# Patient Record
Sex: Female | Born: 1960 | Race: Black or African American | Hispanic: No | Marital: Single | Smoking: Current some day smoker
Health system: Southern US, Community
[De-identification: ages and names within clinical notes are randomized; demographics above are authoritative.]

## PROBLEM LIST (undated history)

## (undated) HISTORY — PX: TUBAL LIGATION: SHX77

---

## 1998-03-04 ENCOUNTER — Other Ambulatory Visit: Admission: RE | Admit: 1998-03-04 | Discharge: 1998-03-04 | Payer: Self-pay | Admitting: Obstetrics

## 2001-09-26 ENCOUNTER — Emergency Department (HOSPITAL_COMMUNITY): Admission: EM | Admit: 2001-09-26 | Discharge: 2001-09-26 | Payer: Self-pay | Admitting: *Deleted

## 2003-07-30 ENCOUNTER — Emergency Department (HOSPITAL_COMMUNITY): Admission: EM | Admit: 2003-07-30 | Discharge: 2003-07-30 | Payer: Self-pay

## 2004-11-17 ENCOUNTER — Emergency Department (HOSPITAL_COMMUNITY): Admission: EM | Admit: 2004-11-17 | Discharge: 2004-11-17 | Payer: Self-pay | Admitting: Emergency Medicine

## 2004-11-19 ENCOUNTER — Emergency Department (HOSPITAL_COMMUNITY): Admission: EM | Admit: 2004-11-19 | Discharge: 2004-11-19 | Payer: Self-pay | Admitting: Emergency Medicine

## 2004-11-26 ENCOUNTER — Encounter: Admission: RE | Admit: 2004-11-26 | Discharge: 2004-11-26 | Payer: Self-pay | Admitting: Family Medicine

## 2004-11-26 ENCOUNTER — Encounter: Admission: RE | Admit: 2004-11-26 | Discharge: 2004-12-18 | Payer: Self-pay | Admitting: Family Medicine

## 2005-01-20 ENCOUNTER — Emergency Department (HOSPITAL_COMMUNITY): Admission: EM | Admit: 2005-01-20 | Discharge: 2005-01-20 | Payer: Self-pay | Admitting: Emergency Medicine

## 2006-06-16 ENCOUNTER — Emergency Department (HOSPITAL_COMMUNITY): Admission: EM | Admit: 2006-06-16 | Discharge: 2006-06-16 | Payer: Self-pay | Admitting: Emergency Medicine

## 2007-10-16 ENCOUNTER — Emergency Department (HOSPITAL_COMMUNITY): Admission: EM | Admit: 2007-10-16 | Discharge: 2007-10-16 | Payer: Self-pay | Admitting: Emergency Medicine

## 2008-04-22 ENCOUNTER — Emergency Department (HOSPITAL_COMMUNITY): Admission: EM | Admit: 2008-04-22 | Discharge: 2008-04-22 | Payer: Self-pay | Admitting: Family Medicine

## 2008-08-04 ENCOUNTER — Emergency Department (HOSPITAL_COMMUNITY): Admission: EM | Admit: 2008-08-04 | Discharge: 2008-08-04 | Payer: Self-pay | Admitting: Emergency Medicine

## 2009-08-11 ENCOUNTER — Emergency Department (HOSPITAL_COMMUNITY): Admission: EM | Admit: 2009-08-11 | Discharge: 2009-08-11 | Payer: Self-pay | Admitting: Emergency Medicine

## 2009-12-04 ENCOUNTER — Emergency Department (HOSPITAL_COMMUNITY): Admission: EM | Admit: 2009-12-04 | Discharge: 2009-12-04 | Payer: Self-pay | Admitting: Emergency Medicine

## 2010-01-10 ENCOUNTER — Emergency Department (HOSPITAL_COMMUNITY): Admission: EM | Admit: 2010-01-10 | Discharge: 2010-01-10 | Payer: Self-pay | Admitting: Emergency Medicine

## 2010-02-28 ENCOUNTER — Emergency Department (HOSPITAL_COMMUNITY): Admission: EM | Admit: 2010-02-28 | Discharge: 2010-02-28 | Payer: Self-pay | Admitting: Emergency Medicine

## 2010-08-27 ENCOUNTER — Emergency Department (HOSPITAL_COMMUNITY)
Admission: EM | Admit: 2010-08-27 | Discharge: 2010-08-27 | Payer: Self-pay | Source: Home / Self Care | Admitting: Emergency Medicine

## 2010-11-18 LAB — URINE MICROSCOPIC-ADD ON

## 2010-11-18 LAB — COMPREHENSIVE METABOLIC PANEL
Albumin: 3.7 g/dL (ref 3.5–5.2)
Alkaline Phosphatase: 88 U/L (ref 39–117)
Calcium: 9 mg/dL (ref 8.4–10.5)
Chloride: 105 mEq/L (ref 96–112)
Creatinine, Ser: 0.66 mg/dL (ref 0.4–1.2)
GFR calc Af Amer: >60 mL/min (ref >60)
GFR calc non Af Amer: >60 mL/min (ref >60)
Glucose, Bld: 93 mg/dL (ref 70–99)
Sodium: 138 mEq/L (ref 135–145)
Total Bilirubin: 0.4 mg/dL (ref 0.3–1.2)
Total Protein: 6.9 g/dL (ref 6.0–8.3)

## 2010-11-18 LAB — URINALYSIS, ROUTINE W REFLEX MICROSCOPIC
Bilirubin Urine: NEGATIVE
Glucose, UA: NEGATIVE mg/dL
Nitrite: NEGATIVE
Specific Gravity, Urine: 1.028 (ref 1.005–1.030)
Urobilinogen, UA: 0.2 mg/dL (ref 0.0–1.0)

## 2010-11-18 LAB — CBC
HCT: 39.1 % (ref 36.0–46.0)
MCV: 83.5 fL (ref 78.0–100.0)
RBC: 4.68 MIL/uL (ref 3.87–5.11)
WBC: 7 10*3/uL (ref 4.0–10.5)

## 2010-11-18 LAB — DIFFERENTIAL
Basophils Absolute: 0 10*3/uL (ref 0.0–0.1)
Basophils Relative: 0 % (ref 0–1)
Eosinophils Absolute: 0.1 10*3/uL (ref 0.0–0.7)
Eosinophils Relative: 1 % (ref 0–5)
Lymphocytes Relative: 20 % (ref 12–46)

## 2010-11-18 LAB — LIPASE, BLOOD: Lipase: 23 U/L (ref 11–59)

## 2010-11-18 LAB — PREGNANCY, URINE: Preg Test, Ur: NEGATIVE

## 2011-05-17 ENCOUNTER — Emergency Department (HOSPITAL_COMMUNITY)
Admission: EM | Admit: 2011-05-17 | Discharge: 2011-05-17 | Disposition: A | Discharge status: Home / Self Care | Payer: Self-pay | Attending: Emergency Medicine | Admitting: Emergency Medicine

## 2011-05-17 DIAGNOSIS — R110 Nausea: Secondary | ICD-10-CM | POA: Insufficient documentation

## 2011-05-17 DIAGNOSIS — F17200 Nicotine dependence, unspecified, uncomplicated: Secondary | ICD-10-CM | POA: Insufficient documentation

## 2011-05-17 DIAGNOSIS — R109 Unspecified abdominal pain: Secondary | ICD-10-CM | POA: Insufficient documentation

## 2011-05-17 DIAGNOSIS — R12 Heartburn: Secondary | ICD-10-CM | POA: Insufficient documentation

## 2011-05-17 LAB — URINE MICROSCOPIC-ADD ON

## 2011-05-17 LAB — URINALYSIS, ROUTINE W REFLEX MICROSCOPIC
Ketones, ur: NEGATIVE mg/dL
Leukocytes, UA: NEGATIVE
Protein, ur: NEGATIVE mg/dL

## 2011-05-17 LAB — PREGNANCY, URINE: Preg Test, Ur: NEGATIVE

## 2011-05-21 LAB — COMPREHENSIVE METABOLIC PANEL WITH GFR
ALT: 15
AST: 16
CO2: 23
GFR calc Af Amer: >60
GFR calc non Af Amer: >60
Potassium: 4
Sodium: 140

## 2011-05-21 LAB — CBC
HCT: 39.7
Hemoglobin: 13.4
MCHC: 33.7
MCV: 83.6
Platelets: 247
RBC: 4.75
RDW: 17.5 — ABNORMAL HIGH
WBC: 10.1

## 2011-05-21 LAB — URINE MICROSCOPIC-ADD ON

## 2011-05-21 LAB — DIFFERENTIAL
Basophils Absolute: 0.1
Basophils Relative: 1
Eosinophils Absolute: 0.1
Eosinophils Relative: 1
Lymphocytes Relative: 10 — ABNORMAL LOW
Lymphs Abs: 1
Monocytes Absolute: 0.3
Monocytes Relative: 3
Neutro Abs: 8.6 — ABNORMAL HIGH
Neutrophils Relative %: 85 — ABNORMAL HIGH

## 2011-05-21 LAB — URINALYSIS, ROUTINE W REFLEX MICROSCOPIC
Bilirubin Urine: NEGATIVE
Nitrite: NEGATIVE
Urobilinogen, UA: 0.2

## 2011-05-21 LAB — COMPREHENSIVE METABOLIC PANEL
Albumin: 3.6
Alkaline Phosphatase: 62
BUN: 11
Calcium: 9.1
Chloride: 109
Creatinine, Ser: 0.61
Glucose, Bld: 134 — ABNORMAL HIGH
Total Bilirubin: 0.7
Total Protein: 6.8

## 2011-05-21 LAB — LIPASE, BLOOD: Lipase: 18

## 2011-06-04 LAB — RAPID STREP SCREEN (MED CTR MEBANE ONLY)

## 2011-09-10 ENCOUNTER — Emergency Department (HOSPITAL_COMMUNITY)
Admission: EM | Admit: 2011-09-10 | Discharge: 2011-09-10 | Disposition: A | Discharge status: Home / Self Care | Payer: Self-pay | Attending: Emergency Medicine | Admitting: Emergency Medicine

## 2011-09-10 ENCOUNTER — Encounter (HOSPITAL_COMMUNITY): Payer: Self-pay | Admitting: Emergency Medicine

## 2011-09-10 DIAGNOSIS — E669 Obesity, unspecified: Secondary | ICD-10-CM | POA: Insufficient documentation

## 2011-09-10 DIAGNOSIS — R059 Cough, unspecified: Secondary | ICD-10-CM | POA: Insufficient documentation

## 2011-09-10 DIAGNOSIS — R509 Fever, unspecified: Secondary | ICD-10-CM | POA: Insufficient documentation

## 2011-09-10 DIAGNOSIS — R05 Cough: Secondary | ICD-10-CM | POA: Insufficient documentation

## 2011-09-10 MED ORDER — HYDROCOD POLST-CHLORPHEN POLST 10-8 MG/5ML PO LQCR: 5.0000 mL | Freq: Two times a day (BID) | ORAL | Status: DC | PRN

## 2011-09-10 NOTE — ED Provider Notes (Signed)
History     CSN: [id number]  Arrival date & time 09/10/11  1513   First MD Initiated Contact with Patient 09/10/11 1615      Chief Complaint  Patient presents with  . Cough    C/O cough, nasal congestion and "itchy" throat since Wednesday    (Consider location/radiation/quality/duration/timing/severity/associated sxs/prior treatment) HPI Complains of cough itchy throat, nasal congestion onset 2 days ago. Treated with Mucinex and Tylenol cold medicine without adequate relief. Is worse at night not made better or worse by anything. Maximum temperature 101 two days ago no fever since. No other complaint no other associated symptoms No past medical history on file.  No past surgical history on file.  No family history on file.  History  Substance Use Topics  . Smoking status: Not on file  . Smokeless tobacco: Not on file  . Alcohol Use: Not on file   former smoker, quit 12/12, occasional alcohol no illicit drug use OB History    No data available      Review of Systems  Constitutional: Positive for fever.  HENT: Positive for congestion.   Respiratory: Positive for cough.   Cardiovascular: Negative.   Gastrointestinal: Negative.   Musculoskeletal: Negative.   Skin: Negative.   Neurological: Negative.   Hematological: Negative.   Psychiatric/Behavioral: Negative.     Allergies  Review of patient's allergies indicates no known allergies.  Home Medications   Current Outpatient Rx  Name Route Sig Dispense Refill  . NAPROXEN SODIUM 220 MG PO TABS Oral Take 220 mg by mouth 2 (two) times daily as needed. pain      LMP 09/08/2011  Physical Exam  Constitutional: She appears well-developed and well-nourished.  HENT:  Head: Normocephalic and atraumatic.  Mouth/Throat: No oropharyngeal exudate.       Bilateral tympanic membranes normal oral pharynx reddened no exudate  Eyes: Conjunctivae are normal. Pupils are equal, round, and reactive to light.  Neck: Neck supple.  No tracheal deviation present. No thyromegaly present.  Cardiovascular: Normal rate and regular rhythm.   No murmur heard. Pulmonary/Chest: Effort normal and breath sounds normal.  Abdominal: Soft. Bowel sounds are normal. She exhibits no distension. There is no tenderness.       obese  Musculoskeletal: Normal range of motion. She exhibits no edema and no tenderness.  Neurological: She is alert. Coordination normal.  Skin: Skin is warm and dry. No rash noted.  Psychiatric: She has a normal mood and affect.    ED Course  Procedures (including critical care time)  Labs Reviewed - No data to display No results found.   No diagnosis found.    MDM  Suspect viral illness. Plan Rx Tussionex tussionex bp recheck      Diagnosis cough        Doug Sou, MD 09/10/11 1637

## 2012-01-02 ENCOUNTER — Encounter (HOSPITAL_COMMUNITY): Payer: Self-pay | Admitting: Emergency Medicine

## 2012-01-02 ENCOUNTER — Emergency Department (HOSPITAL_COMMUNITY)
Admission: EM | Admit: 2012-01-02 | Discharge: 2012-01-02 | Disposition: A | Discharge status: Home / Self Care | Payer: Self-pay | Attending: Emergency Medicine | Admitting: Emergency Medicine

## 2012-01-02 DIAGNOSIS — N390 Urinary tract infection, site not specified: Secondary | ICD-10-CM | POA: Insufficient documentation

## 2012-01-02 DIAGNOSIS — K529 Noninfective gastroenteritis and colitis, unspecified: Secondary | ICD-10-CM

## 2012-01-02 DIAGNOSIS — K5289 Other specified noninfective gastroenteritis and colitis: Secondary | ICD-10-CM | POA: Insufficient documentation

## 2012-01-02 DIAGNOSIS — R1110 Vomiting, unspecified: Secondary | ICD-10-CM | POA: Insufficient documentation

## 2012-01-02 DIAGNOSIS — R197 Diarrhea, unspecified: Secondary | ICD-10-CM | POA: Insufficient documentation

## 2012-01-02 DIAGNOSIS — F17200 Nicotine dependence, unspecified, uncomplicated: Secondary | ICD-10-CM | POA: Insufficient documentation

## 2012-01-02 LAB — URINALYSIS, ROUTINE W REFLEX MICROSCOPIC
Glucose, UA: NEGATIVE mg/dL
Ketones, ur: 15 mg/dL — AB
Protein, ur: 100 mg/dL — AB
pH: 5.5 (ref 5.0–8.0)

## 2012-01-02 LAB — URINE MICROSCOPIC-ADD ON

## 2012-01-02 LAB — POCT I-STAT, CHEM 8
BUN: 12 mg/dL (ref 6–23)
Chloride: 105 mEq/L (ref 96–112)
Creatinine, Ser: 0.6 mg/dL (ref 0.50–1.10)
Sodium: 138 mEq/L (ref 135–145)
TCO2: 24 mmol/L (ref 0–100)

## 2012-01-02 LAB — POCT PREGNANCY, URINE: Preg Test, Ur: NEGATIVE

## 2012-01-02 MED ORDER — ONDANSETRON HCL 4 MG/2ML IJ SOLN
4.0000 mg | Freq: Once | INTRAMUSCULAR | Status: AC
  Administered 2012-01-02: 4 mg via INTRAVENOUS
  Filled 2012-01-02: qty 2

## 2012-01-02 MED ORDER — SODIUM CHLORIDE 0.9 % IV BOLUS (SEPSIS)
1000.0000 mL | Freq: Once | INTRAVENOUS | Status: AC
  Administered 2012-01-02: 1000 mL via INTRAVENOUS

## 2012-01-02 MED ORDER — SULFAMETHOXAZOLE-TRIMETHOPRIM 800-160 MG PO TABS: 1.0000 | ORAL_TABLET | Freq: Two times a day (BID) | ORAL | Status: AC

## 2012-01-02 MED ORDER — ACETAMINOPHEN 325 MG PO TABS
650.0000 mg | ORAL_TABLET | Freq: Once | ORAL | Status: AC
  Administered 2012-01-02: 650 mg via ORAL

## 2012-01-02 MED ORDER — ONDANSETRON HCL 4 MG PO TABS: 4.0000 mg | ORAL_TABLET | Freq: Four times a day (QID) | ORAL | Status: AC

## 2012-01-02 MED ORDER — ACETAMINOPHEN 325 MG PO TABS
ORAL_TABLET | ORAL | Status: AC
  Administered 2012-01-02: 650 mg via ORAL
  Filled 2012-01-02: qty 2

## 2012-01-02 NOTE — ED Notes (Signed)
Pt c/o N/V/D starting this am while on way to work; pt sts may have been around someone that was sick

## 2012-01-02 NOTE — ED Provider Notes (Signed)
History     CSN: [id number]  Arrival date & time 01/02/12  1311   First MD Initiated Contact with Patient 01/02/12 1334      Chief Complaint  Patient presents with  . Emesis  . 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 female presenting with vomiting and diarrhea. The history is provided by the patient. No language interpreter was used.  Emesis  This is a new problem. The current episode started 1 to 2 hours ago. The problem occurs 2 to 4 times per day. The problem has not changed since onset.The emesis has an appearance of stomach contents. There has been no fever. Associated symptoms include abdominal pain and diarrhea. Pertinent negatives include no fever.  Diarrhea The primary symptoms include abdominal pain, vomiting and diarrhea. Primary symptoms do not include fever. The illness began today. The problem has not changed since onset. The illness does not include back pain.    History reviewed. No pertinent past medical history.  History reviewed. No pertinent past surgical history.  History reviewed. No pertinent family history.  History  Substance Use Topics  . Smoking status: Current Some Day Smoker    Last Attempt to Quit: 08/27/2011  . Smokeless tobacco: Not on file  . Alcohol Use: No     occcasional    OB History    Grav Para Term Preterm Abortions TAB SAB Ect Mult Living                  Review of Systems  Constitutional: Negative for fever.  Respiratory: Negative.   Cardiovascular: Negative.   Gastrointestinal: Positive for vomiting, abdominal pain and diarrhea.  Musculoskeletal: Negative for back pain.  Neurological: Negative.     Allergies  Review of patient's allergies indicates no known allergies.  Home Medications   Current Outpatient Rx  Name Route Sig Dispense Refill  . ADULT MULTIVITAMIN  W/MINERALS CH Oral Take 1 tablet by mouth daily.      BP 126/64  Pulse 98  Temp(Src) 98.1 F (36.7 C) (Oral)  Resp 16  SpO2 99%  Physical Exam  Nursing note and vitals reviewed. Constitutional: She is oriented to person, place, and time. She appears well-developed and well-nourished.  HENT:  Head: Normocephalic and atraumatic.  Neck: Neck supple.  Cardiovascular: Normal rate and regular rhythm.   Pulmonary/Chest: Effort normal and breath sounds normal.  Abdominal: Soft. Bowel sounds are normal. There is no tenderness.  Musculoskeletal: Normal range of motion.  Neurological: She is alert and oriented to person, place, and time.  Skin: Skin is warm and dry.    ED Course  Procedures (including critical care time)  Labs Reviewed - No data to display No results found.   1. UTI (lower urinary tract infection)   2. Gastroenteritis       MDM  Pt is tolerating po and is feeling better at this time        Teressa Lower, NP 01/02/12 1642  Teressa Lower, NP 01/02/12 1642

## 2012-01-02 NOTE — ED Provider Notes (Signed)
Medical screening examination/treatment/procedure(s) were performed by non-physician practitioner and as supervising physician I was immediately available for consultation/collaboration.   Kristy Schomburg A Darenda Fike, MD 01/02/12 1952 

## 2012-01-02 NOTE — Discharge Instructions (Signed)
B.R.A.T. Diet Your doctor has recommended the B.R.A.T. diet for you or your child until the condition improves. This is often used to help control diarrhea and vomiting symptoms. If you or your child can tolerate clear liquids, you may have:  Bananas.   Rice.   Applesauce.   Toast (and other simple starches such as crackers, potatoes, noodles).  Be sure to avoid dairy products, meats, and fatty foods until symptoms are better. Fruit juices such as apple, grape, and prune juice can make diarrhea worse. Avoid these. Continue this diet for 2 days or as instructed by your caregiver. Document Released: 08/16/2005 Document Revised: 08/05/2011 Document Reviewed: 02/02/2007 Exit[employer] Patient Information 2012 [employer]. 

## 2013-05-24 ENCOUNTER — Emergency Department (HOSPITAL_COMMUNITY)
Admission: EM | Admit: 2013-05-24 | Discharge: 2013-05-24 | Disposition: A | Discharge status: Home / Self Care | Payer: Self-pay | Attending: Emergency Medicine | Admitting: Emergency Medicine

## 2013-05-24 ENCOUNTER — Encounter (HOSPITAL_COMMUNITY): Payer: Self-pay | Admitting: Family Medicine

## 2013-05-24 DIAGNOSIS — J3489 Other specified disorders of nose and nasal sinuses: Secondary | ICD-10-CM | POA: Insufficient documentation

## 2013-05-24 DIAGNOSIS — S0501XA Injury of conjunctiva and corneal abrasion without foreign body, right eye, initial encounter: Secondary | ICD-10-CM

## 2013-05-24 DIAGNOSIS — Y929 Unspecified place or not applicable: Secondary | ICD-10-CM | POA: Insufficient documentation

## 2013-05-24 DIAGNOSIS — F17200 Nicotine dependence, unspecified, uncomplicated: Secondary | ICD-10-CM | POA: Insufficient documentation

## 2013-05-24 DIAGNOSIS — X58XXXA Exposure to other specified factors, initial encounter: Secondary | ICD-10-CM | POA: Insufficient documentation

## 2013-05-24 DIAGNOSIS — Z79899 Other long term (current) drug therapy: Secondary | ICD-10-CM | POA: Insufficient documentation

## 2013-05-24 DIAGNOSIS — S058X9A Other injuries of unspecified eye and orbit, initial encounter: Secondary | ICD-10-CM | POA: Insufficient documentation

## 2013-05-24 DIAGNOSIS — Y939 Activity, unspecified: Secondary | ICD-10-CM | POA: Insufficient documentation

## 2013-05-24 MED ORDER — ERYTHROMYCIN 5 MG/GM OP OINT: TOPICAL_OINTMENT | OPHTHALMIC | Status: DC

## 2013-05-24 MED ORDER — HYDROCODONE-ACETAMINOPHEN 5-325 MG PO TABS: 1.0000 | ORAL_TABLET | Freq: Four times a day (QID) | ORAL | Status: DC | PRN

## 2013-05-24 MED ORDER — TETRACAINE HCL 0.5 % OP SOLN
2.0000 [drp] | Freq: Once | OPHTHALMIC | Status: DC
  Filled 2013-05-24: qty 2

## 2013-05-24 NOTE — ED Notes (Signed)
Patient states that her eyes have been itching, watering and burning since yesterday.

## 2013-05-24 NOTE — ED Provider Notes (Signed)
Medical screening examination/treatment/procedure(s) were conducted as a shared visit with non-physician practitioner(s) and myself.  I personally evaluated the patient during the encounter  Patient with eye irritation. Corneal abrasion at 6 o'clock with vague borders. No dendritic branching, no concerns for ulcer. Given erythro ointiment.   Dagmar Hait, MD 05/24/13 [phone]

## 2013-05-24 NOTE — ED Provider Notes (Signed)
CSN: [id number]     Arrival date & time 05/24/13  7829 History   First MD Initiated Contact with Patient 05/24/13 [phone]     Chief Complaint  Patient presents with  . Eye Drainage   (Consider location/radiation/quality/duration/timing/severity/associated sxs/prior Treatment) HPI Comments: Patient presents today with itchy,watery eyes.  She reports that her symptoms have been present since yesterday.  Symptoms worse in the right eye.  She does have some eye pain of the right eye.  She does have a history of seasonal allergies and has been taking Zyrtec, but does not feel that it is helping.  She also reports that her right eye has feel irritated and painful.  She feels a foreign body sensation in her right eye.  She denies any trauma to the eye.  She denies any thick discharge from her eyes.  Denies any swelling.  She does report some photophobia of the right eye.    She reports that her vision appears to be blurred particularly in the right eye.    She does not wear corrective lenses.  She has had some associated rhinorrhea.    The history is provided by the patient.    History reviewed. No pertinent past medical history. Past Surgical History  Procedure Laterality Date  . Tubal ligation     No family history on file. History  Substance Use Topics  . Smoking status: Current Some Day Smoker -- 0.25 packs/day    Types: Cigarettes  . Smokeless tobacco: Not on file  . Alcohol Use: No     Comment: occcasional   OB History   Grav Para Term Preterm Abortions TAB SAB Ect Mult Living                 Review of Systems  HENT: Positive for rhinorrhea.   Eyes: Positive for pain, discharge (watery discharge), redness and itching.  All other systems reviewed and are negative.    Allergies  Review of patient's allergies indicates no known allergies.  Home Medications   Current Outpatient Rx  Name  Route  Sig  Dispense  Refill  . Multiple Vitamin (MULITIVITAMIN WITH MINERALS) TABS    Oral   Take 1 tablet by mouth daily.          BP 159/71  Pulse 79  Temp(Src) 97.8 F (36.6 C) (Oral)  Resp 18  Ht 5\' 7"  (1.702 m)  Wt 264 lb (119.75 kg)  BMI 41.34 kg/m2  SpO2 99%  LMP 05/22/2013 Physical Exam  Nursing note and vitals reviewed. Constitutional: She appears well-developed and well-nourished.  HENT:  Head: Normocephalic and atraumatic.  Mouth/Throat: Oropharynx is clear and moist.  Eyes: EOM and lids are normal. Pupils are equal, round, and reactive to light. Lids are everted and swept, no foreign bodies found. Right eye exhibits no exudate. No foreign body present in the right eye.  Slit lamp exam:      The right eye shows corneal abrasion and fluorescein uptake.  Mild injection of the right eye.  Watery discharge of the right eye.  IOP right eye 17.  Neck: Normal range of motion. Neck supple.  Cardiovascular: Normal rate, regular rhythm and normal heart sounds.   Pulmonary/Chest: Effort normal and breath sounds normal.  Neurological: She is alert.  Skin: Skin is warm and dry. No rash noted.  No facial rash visualized  Psychiatric: She has a normal mood and affect.    ED Course  Procedures (including critical care time) Labs Review Labs Reviewed -  No data to display Imaging Review No results found.  Patient discussed with Dr. Gwendolyn Grant who also evaluated the patient.  MDM  No diagnosis found. Patient presents with itching and irritation of her right eye.  She has also had some watery discharge.  No trauma to the eye.  IOP of the right eye is 17.  On exam patient found to have a corneal abrasion.  Patient started on Erythromycin ointment and given pain medication.  Patient instructed to follow up with Opthalmology.  Return precautions discussed.    Pascal Lux [place], PA-C 05/24/13 0825  Magnus Sinning, PA-C 05/24/13 [phone]

## 2013-09-25 ENCOUNTER — Ambulatory Visit (INDEPENDENT_AMBULATORY_CARE_PROVIDER_SITE_OTHER): Payer: PRIVATE HEALTH INSURANCE | Admitting: Physician Assistant

## 2013-09-25 VITALS — BP 118/72 | HR 99 | Temp 99.0°F | Resp 18 | Ht 65.5 in | Wt 274.0 lb

## 2013-09-25 DIAGNOSIS — R509 Fever, unspecified: Secondary | ICD-10-CM

## 2013-09-25 DIAGNOSIS — R05 Cough: Secondary | ICD-10-CM

## 2013-09-25 DIAGNOSIS — J020 Streptococcal pharyngitis: Secondary | ICD-10-CM

## 2013-09-25 DIAGNOSIS — R062 Wheezing: Secondary | ICD-10-CM

## 2013-09-25 DIAGNOSIS — R059 Cough, unspecified: Secondary | ICD-10-CM

## 2013-09-25 LAB — POCT CBC
Granulocyte percent: 60.3 %G (ref 37–80)
HCT, POC: 43.6 % (ref 37.7–47.9)
Hemoglobin: 13.2 g/dL (ref 12.2–16.2)
LYMPH, POC: 3.7 — AB (ref 0.6–3.4)
MCH, POC: 25.5 pg — AB (ref 27–31.2)
MCHC: 30.3 g/dL — AB (ref 31.8–35.4)
MCV: 84.4 fL (ref 80–97)
MID (cbc): 0.7 (ref 0–0.9)
MPV: 8.9 fL (ref 0–99.8)
POC GRANULOCYTE: 6.6 (ref 2–6.9)
POC LYMPH %: 33.5 % (ref 10–50)
POC MID %: 6.2 % (ref 0–12)
Platelet Count, POC: 330 10*3/uL (ref 142–424)
RBC: 5.17 M/uL (ref 4.04–5.48)
RDW, POC: 17.4 %
WBC: 11 10*3/uL — AB (ref 4.6–10.2)

## 2013-09-25 LAB — POCT INFLUENZA A/B
INFLUENZA A, POC: NEGATIVE
Influenza B, POC: NEGATIVE

## 2013-09-25 LAB — POCT RAPID STREP A (OFFICE): Rapid Strep A Screen: POSITIVE — AB

## 2013-09-25 MED ORDER — AMOXICILLIN 875 MG PO TABS: 875.0000 mg | ORAL_TABLET | Freq: Two times a day (BID) | ORAL | Status: DC

## 2013-09-25 MED ORDER — ALBUTEROL SULFATE (2.5 MG/3ML) 0.083% IN NEBU
2.5000 mg | INHALATION_SOLUTION | Freq: Once | RESPIRATORY_TRACT | Status: AC
  Administered 2013-09-25: 2.5 mg via RESPIRATORY_TRACT

## 2013-09-25 MED ORDER — HYDROCODONE-HOMATROPINE 5-1.5 MG/5ML PO SYRP: ORAL_SOLUTION | ORAL | Status: DC

## 2013-09-25 MED ORDER — E-Z SPACER DEVI: Status: DC

## 2013-09-25 MED ORDER — ALBUTEROL SULFATE HFA 108 (90 BASE) MCG/ACT IN AERS: 2.0000 | INHALATION_SPRAY | RESPIRATORY_TRACT | Status: DC | PRN

## 2013-09-25 NOTE — Progress Notes (Signed)
Subjective:    Patient ID: Haley Erickson, female    DOB: 14-Jun-1961, 53 y.o.   MRN: [id number]  HPI 53 year old female presents with a 4 day history of nasal congestion, post nasal drip, sore throat, and cough. Mild sinus pressure. Subjective fever and chills. Nasal congestion thick and green/yellow. Cough is productive of green sputum and worse as the day progresses and into nighttime. Some SOB and wheezing. Ears feel full, leading to sensation of muffled hearing. Symptoms began with sore throat. Has tried OTC cold preps without success. No GI complaints. Appetite normal. 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 Multiple people out sick with similar illness. She did get a flu shot this year. Some tobacco use.   No recent antibiotics or recent travels.   No leg trauma, sedentary periods, or h/o cancer.  She does have a history of asthma as a child, but states she "grew out of this."   PMH: History reviewed. No pertinent past medical history.   Home Meds: Prior to Admission medications   Not on File    Allergies: No Known Allergies  History   Social History  . Marital Status: Single    Spouse Name: N/A    Number of Children: N/A  . Years of Education: N/A   Occupational History  . Not on file.   Social History Main Topics  . Smoking status: Current Some Day Smoker -- 0.25 packs/day    Types: Cigarettes  . Smokeless tobacco: Not on file  . Alcohol Use: No     Comment: occcasional  . Drug Use: No  . Sexual Activity: Not on file   Other Topics Concern  . Not on file   Social History Narrative  . No narrative on file       Review of Systems  Constitutional: Positive for chills. Negative for fever and fatigue.  HENT: Positive for congestion, ear pain, hearing loss, postnasal drip, rhinorrhea, sinus pressure and sore throat.        Laryngitis.  Sinus pain along the maxillary sinus.   Respiratory: Positive for  cough, shortness of breath and wheezing.        Cough is productive of green sputum and worse later during the day/nighttime.   Gastrointestinal: Positive for vomiting. Negative for nausea and diarrhea.       Vomiting secondary to coughing episodes.   Musculoskeletal: Positive for myalgias.  Allergic/Immunologic: Positive for environmental allergies.  Neurological: Positive for headaches.       Sinus headaches.        Objective:   Physical Exam  Physical Exam: Blood pressure 118/72, pulse 99, temperature 99 F (37.2 C), temperature source Oral, resp. rate 18, height 5' 5.5" (1.664 m), weight 274 lb (124.286 kg), last menstrual period 05/22/2013, SpO2 99.00%., Body mass index is 44.89 kg/(m^2). General: Well developed, well nourished, in no acute distress. Head: Normocephalic, atraumatic, eyes without discharge, sclera non-icteric, nares are congested. Bilateral auditory canals clear, TM's are without perforation, pearly grey with reflective cone of light bilaterally. No sinus TTP. Oral cavity moist, dentition normal. Posterior pharynx with post nasal drip and mild erythema. No peritonsillar abscess or tonsillar exudate. Uvula midline.  Neck: Supple. No thyromegaly. Full ROM. Lymph nodes: less than 2 cm AC bilaterally. Lungs: Clear to auscultation bilaterally with mild bilateral wheezing. No rales or rhonchi. Breathing is unlabored. Status post albuterol neb: Heart: RRR with S1 S2. No murmurs, rubs, or gallops  appreciated. Msk:  Strength and tone normal for age. Extremities: No clubbing or cyanosis. No edema. Neuro: Alert and oriented X 3. Moves all extremities spontaneously. CNII-XII grossly in tact. Psych:  Responds to questions appropriately with a normal affect.   Labs: Results for orders placed in visit on 09/25/13  POCT CBC      Result Value Range   WBC 11.0 (*) 4.6 - 10.2 K/uL   Lymph, poc 3.7 (*) 0.6 - 3.4   POC LYMPH PERCENT 33.5  10 - 50 %L   MID (cbc) 0.7  0 - 0.9   POC  MID % 6.2  0 - 12 %M   POC Granulocyte 6.6  2 - 6.9   Granulocyte percent 60.3  37 - 80 %G   RBC 5.17  4.04 - 5.48 M/uL   Hemoglobin 13.2  12.2 - 16.2 g/dL   HCT, POC 81.143.6  91.437.7 - 47.9 %   MCV 84.4  80 - 97 fL   MCH, POC 25.5 (*) 27 - 31.2 pg   MCHC 30.3 (*) 31.8 - 35.4 g/dL   RDW, POC 78.217.4     Platelet Count, POC 330  142 - 424 K/uL   MPV 8.9  0 - 99.8 fL  POCT INFLUENZA A/B      Result Value Range   Influenza A, POC Negative     Influenza B, POC Negative    POCT RAPID STREP A (OFFICE)      Result Value Range   Rapid Strep A Screen Positive (*) Negative        Assessment & Plan:  53 year old female with strep pharyngitis, cough, fever, and wheezing -Albuterol neb in office -Amoxicillin 875 mg 1 po bid #20 no RF -Hycodan #4oz 1 tsp po q 4-6 hours prn cough no RF SED -Proventil 2 puffs inhaled q 4-6 hours prn #1 RF 1 -Spacer -New tooth brush -Rest/fluids -RTC precautions   Eula Listenyan Ifeoluwa Beller, MHS, PA-C [facility] [address] [place], [place] [id number] [phone] [facility] 09/25/2013 4:30 PM

## 2013-09-27 ENCOUNTER — Telehealth: Payer: Self-pay

## 2013-09-27 MED ORDER — FLUCONAZOLE 150 MG PO TABS: 150.0000 mg | ORAL_TABLET | Freq: Once | ORAL | Status: DC

## 2013-09-27 NOTE — Telephone Encounter (Signed)
Diflucan needed for treatment after taking antibiotics.   [facility] [address]  [phone]

## 2013-09-27 NOTE — Telephone Encounter (Signed)
Sent in 2 doses of Diflucan. Pt advised.

## 2014-11-25 ENCOUNTER — Emergency Department (HOSPITAL_COMMUNITY)
Admission: EM | Admit: 2014-11-25 | Discharge: 2014-11-25 | Disposition: A | Discharge status: Home / Self Care | Payer: PRIVATE HEALTH INSURANCE | Source: Home / Self Care | Attending: Emergency Medicine | Admitting: Emergency Medicine

## 2014-11-25 ENCOUNTER — Encounter (HOSPITAL_COMMUNITY): Payer: Self-pay | Admitting: Emergency Medicine

## 2014-11-25 DIAGNOSIS — J029 Acute pharyngitis, unspecified: Secondary | ICD-10-CM | POA: Diagnosis not present

## 2014-11-25 DIAGNOSIS — J020 Streptococcal pharyngitis: Secondary | ICD-10-CM

## 2014-11-25 LAB — POCT RAPID STREP A: Streptococcus, Group A Screen (Direct): NEGATIVE

## 2014-11-25 MED ORDER — FLUCONAZOLE 150 MG PO TABS: 150.0000 mg | ORAL_TABLET | Freq: Once | ORAL | Status: DC

## 2014-11-25 MED ORDER — AMOXICILLIN 875 MG PO TABS: 875.0000 mg | ORAL_TABLET | Freq: Two times a day (BID) | ORAL | Status: DC

## 2014-11-25 NOTE — Discharge Instructions (Signed)
You have a pharyngitis. Take amoxicillin twice a day for 10 days. Use the Diflucan after you have completed the antibiotics. Follow-up as needed.

## 2014-11-25 NOTE — ED Provider Notes (Signed)
CSN: [id number]     Arrival date & time 11/25/14  1254 History   First MD Initiated Contact with Patient 11/25/14 1334     No chief complaint on file. CC: sore throat  (Consider location/radiation/quality/duration/timing/severity/associated sxs/prior Treatment) HPI She is a 54 year old woman here for evaluation of sore throat. She states her symptoms started 2 days ago with sore throat and some mild nausea. She also reports some mild nasal congestion and cough. Her ears have started to bother her the last day or so. She denies any fevers or chills. No vomiting. No nodes sick contacts.  No past medical history on file. Past Surgical History  Procedure Laterality Date  . Tubal ligation     Family History  Problem Relation Age of Onset  . Diabetes Brother    History  Substance Use Topics  . Smoking status: Current Some Day Smoker -- 0.25 packs/day    Types: Cigarettes  . Smokeless tobacco: Not on file  . Alcohol Use: No     Comment: occcasional   OB History    No data available     Review of Systems  Constitutional: Negative for fever and chills.  HENT: Positive for congestion, rhinorrhea and sore throat. Negative for trouble swallowing.   Respiratory: Positive for cough. Negative for shortness of breath.   Gastrointestinal: Positive for nausea. Negative for vomiting.  Musculoskeletal: Negative for myalgias.  Neurological: Negative for headaches.    Allergies  Review of patient's allergies indicates no known allergies.  Home Medications   Prior to Admission medications   Medication Sig Start Date End Date Taking? Authorizing Provider  albuterol (PROVENTIL HFA;VENTOLIN HFA) 108 (90 BASE) MCG/ACT inhaler Inhale 2 puffs into the lungs every 4 (four) hours as needed for wheezing or shortness of breath. 09/25/13   Sondra Bargesyan M Dunn, PA-C  amoxicillin (AMOXIL) 875 MG tablet Take 1 tablet (875 mg total) by mouth 2 (two) times daily. 11/25/14   Charm RingsErin J Pattye Meda, MD  fluconazole (DIFLUCAN)  150 MG tablet Take 1 tablet (150 mg total) by mouth once. Repeat in 3 days as needed. 11/25/14   Charm RingsErin J Williamson [place], MD  HYDROcodone-homatropine [facility](HYCODAN) 5-1.5 MG/5ML syrup 1 TSP PO Q 4-6 HOURS PRN COUGH 09/25/13   Sondra Bargesyan M Dunn, PA-C  Spacer/Aero-Holding Chambers (E-Z SPACER) inhaler Use as instructed 09/25/13   Raymon Muttonyan M Dunn, PA-C   BP 144/94 mmHg  Pulse 81  Temp(Src) 97.7 F (36.5 C) (Oral)  Resp 18  SpO2 98%  LMP 05/22/2013 Physical Exam  Constitutional: She is oriented to person, place, and time. She appears well-developed and well-nourished. No distress.  HENT:  Head: Normocephalic and atraumatic.  Right Ear: External ear normal. Tympanic membrane is erythematous. No middle ear effusion.  Left Ear: Tympanic membrane and external ear normal.  Nose: Nose normal.  Mouth/Throat: Oropharyngeal exudate and posterior oropharyngeal erythema present.  Neck: Neck supple.  Cardiovascular: Normal rate, regular rhythm and normal heart sounds.   No murmur heard. Pulmonary/Chest: Effort normal and breath sounds normal. No respiratory distress. She has no wheezes. She has no rales.  Lymphadenopathy:    She has cervical adenopathy (right submandibular).  Neurological: She is alert and oriented to person, place, and time.    ED Course  Procedures (including critical care time) Labs Review Labs Reviewed  POCT RAPID STREP A ([facility] ONLY)    Imaging Review No results found.   MDM   1. Pharyngitis   2. Streptococcal sore throat    Exam consistent with  strep or bacterial pharyngitis. We'll treat with amoxicillin. Diflucan prescription provided to use after the amoxicillin. Follow-up as needed.    Charm Rings, MD 11/25/14 [phone]

## 2014-11-25 NOTE — ED Notes (Signed)
Pt states that she has had a sore throat and ear pain since 11/23/2014

## 2014-11-28 LAB — CULTURE, GROUP A STREP: STREP A CULTURE: NEGATIVE

## 2014-12-27 ENCOUNTER — Encounter (HOSPITAL_COMMUNITY): Payer: Self-pay | Admitting: Emergency Medicine

## 2014-12-27 ENCOUNTER — Emergency Department (HOSPITAL_COMMUNITY)
Admission: EM | Admit: 2014-12-27 | Discharge: 2014-12-27 | Disposition: A | Discharge status: Home / Self Care | Payer: PRIVATE HEALTH INSURANCE | Source: Home / Self Care | Attending: Family Medicine | Admitting: Family Medicine

## 2014-12-27 DIAGNOSIS — J039 Acute tonsillitis, unspecified: Secondary | ICD-10-CM | POA: Diagnosis not present

## 2014-12-27 DIAGNOSIS — J301 Allergic rhinitis due to pollen: Secondary | ICD-10-CM | POA: Diagnosis not present

## 2014-12-27 LAB — POCT RAPID STREP A: STREPTOCOCCUS, GROUP A SCREEN (DIRECT): NEGATIVE

## 2014-12-27 MED ORDER — PREDNISONE 20 MG PO TABS: ORAL_TABLET | ORAL | Status: DC

## 2014-12-27 MED ORDER — IPRATROPIUM BROMIDE 0.06 % NA SOLN: 2.0000 | Freq: Four times a day (QID) | NASAL | Status: DC

## 2014-12-27 MED ORDER — AMOXICILLIN 500 MG PO CAPS: 500.0000 mg | ORAL_CAPSULE | Freq: Three times a day (TID) | ORAL | Status: DC

## 2014-12-27 NOTE — ED Notes (Signed)
C/o sore throat, bilateral ear pain, HA, chills onset 3 days Denies fevers Alert, no signs of acute distress.

## 2014-12-27 NOTE — Discharge Instructions (Signed)
Allergic Rhinitis flonase nasal spray Use lots of saline nasal spray Sudafed PE 10 mg for congestion Allegra 180 mg for drainage and if need stronger medicine may take Chlor-Trimeton 2 mg, but can cause drowsiness.  Allergic rhinitis is when the mucous membranes in the nose respond to allergens. Allergens are particles in the air that cause your body to have an allergic reaction. This causes you to release allergic antibodies. Through a chain of events, these eventually cause you to release histamine into the blood stream. Although meant to protect the body, it is this release of histamine that causes your discomfort, such as frequent sneezing, congestion, and an itchy, runny nose.  CAUSES  Seasonal allergic rhinitis (hay fever) is caused by pollen allergens that may come from grasses, trees, and weeds. Year-round allergic rhinitis (perennial allergic rhinitis) is caused by allergens such as house dust mites, pet dander, and mold spores.  SYMPTOMS   Nasal stuffiness (congestion).  Itchy, runny nose with sneezing and tearing of the eyes. DIAGNOSIS  Your health care provider can help you determine the allergen or allergens that trigger your symptoms. If you and your health care provider are unable to determine the allergen, skin or blood testing may be used. TREATMENT  Allergic rhinitis does not have a cure, but it can be controlled by:  Medicines and allergy shots (immunotherapy).  Avoiding the allergen. Hay fever may often be treated with antihistamines in pill or nasal spray forms. Antihistamines block the effects of histamine. There are over-the-counter medicines that may help with nasal congestion and swelling around the eyes. Check with your health care provider before taking or giving this medicine.  If avoiding the allergen or the medicine prescribed do not work, there are many new medicines your health care provider can prescribe. Stronger medicine may be used if initial measures are  ineffective. Desensitizing injections can be used if medicine and avoidance does not work. Desensitization is when a patient is given ongoing shots until the body becomes less sensitive to the allergen. Make sure you follow up with your health care provider if problems continue. HOME CARE INSTRUCTIONS It is not possible to completely avoid allergens, but you can reduce your symptoms by taking steps to limit your exposure to them. It helps to know exactly what you are allergic to so that you can avoid your specific triggers. SEEK MEDICAL CARE IF:   You have a fever.  You develop a cough that does not stop easily (persistent).  You have shortness of breath.  You start wheezing.  Symptoms interfere with normal daily activities. Document Released: 05/11/2001 Document Revised: 08/21/2013 Document Reviewed: 04/23/2013 [facility] Patient Information 2015 [place], [place]. This information is not intended to replace advice given to you by your health care provider. Make sure you discuss any questions you have with your health care provider.  Tonsillitis Tonsillitis is an infection of the throat that causes the tonsils to become red, tender, and swollen. Tonsils are collections of lymphoid tissue at the back of the throat. Each tonsil has crevices (crypts). Tonsils help fight nose and throat infections and keep infection from spreading to other parts of the body for the first 18 months of life.  CAUSES Sudden (acute) tonsillitis is usually caused by infection with streptococcal bacteria. Long-lasting (chronic) tonsillitis occurs when the crypts of the tonsils become filled with pieces of food and bacteria, which makes it easy for the tonsils to become repeatedly infected. SYMPTOMS  Symptoms of tonsillitis include:  A sore throat, with  possible difficulty swallowing.  White patches on the tonsils.  Fever.  Tiredness.  New episodes of snoring during sleep, when you did not snore before.  Small,  foul-smelling, yellowish-white pieces of material (tonsilloliths) that you occasionally cough up or spit out. The tonsilloliths can also cause you to have bad breath. DIAGNOSIS Tonsillitis can be diagnosed through a physical exam. Diagnosis can be confirmed with the results of lab tests, including a throat culture. TREATMENT  The goals of tonsillitis treatment include the reduction of the severity and duration of symptoms and prevention of associated conditions. Symptoms of tonsillitis can be improved with the use of steroids to reduce the swelling. Tonsillitis caused by bacteria can be treated with antibiotic medicines. Usually, treatment with antibiotic medicines is started before the cause of the tonsillitis is known. However, if it is determined that the cause is not bacterial, antibiotic medicines will not treat the tonsillitis. If attacks of tonsillitis are severe and frequent, your health care provider may recommend surgery to remove the tonsils (tonsillectomy). HOME CARE INSTRUCTIONS   Rest as much as possible and get plenty of sleep.  Drink plenty of fluids. While the throat is very sore, eat soft foods or liquids, such as sherbet, soups, or instant breakfast drinks.  Eat frozen ice pops.  Gargle with a warm or cold liquid to help soothe the throat. Mix 1/4 teaspoon of salt and 1/4 teaspoon of baking soda in 8 oz of water. SEEK MEDICAL CARE IF:   Large, tender lumps develop in your neck.  A rash develops.  A green, yellow-brown, or bloody substance is coughed up.  You are unable to swallow liquids or food for 24 hours.  You notice that only one of the tonsils is swollen. SEEK IMMEDIATE MEDICAL CARE IF:   You develop any new symptoms such as vomiting, severe headache, stiff neck, chest pain, or trouble breathing or swallowing.  You have severe throat pain along with drooling or voice changes.  You have severe pain, unrelieved with recommended medications.  You are unable to  fully open the mouth.  You develop redness, swelling, or severe pain anywhere in the neck.  You have a fever. MAKE SURE YOU:   Understand these instructions.  Will watch your condition.  Will get help right away if you are not doing well or get worse. Document Released: 05/26/2005 Document Revised: 12/31/2013 Document Reviewed: 02/02/2013 [facility] Patient Information 2015 [place], [place]. This information is not intended to replace advice given to you by your health care provider. Make sure you discuss any questions you have with your health care provider.

## 2014-12-27 NOTE — ED Provider Notes (Signed)
CSN: [id number]     Arrival date & time 12/27/14  1509 History   First MD Initiated Contact with Patient 12/27/14 1655     No chief complaint on file.  (Consider location/radiation/quality/duration/timing/severity/associated sxs/prior Treatment) HPI Comments: 54 year old female complaining of sore throat, earaches, headache, PND, cough, runny nose since yesterday. It is worse today. She denies fever or shortness of breath.   History reviewed. No pertinent past medical history. Past Surgical History  Procedure Laterality Date  . Tubal ligation     Family History  Problem Relation Age of Onset  . Diabetes Brother    History  Substance Use Topics  . Smoking status: Current Some Day Smoker -- 0.25 packs/day    Types: Cigarettes  . Smokeless tobacco: Not on file  . Alcohol Use: No     Comment: occcasional   OB History    No data available     Review of Systems  Constitutional: Positive for activity change and fatigue. Negative for fever, chills and appetite change.  HENT: Positive for congestion, postnasal drip, rhinorrhea, sinus pressure and sore throat. Negative for facial swelling.   Eyes: Negative.   Respiratory: Positive for cough. Negative for chest tightness, shortness of breath and wheezing.   Cardiovascular: Negative.   Gastrointestinal: Negative.   Genitourinary: Negative.   Musculoskeletal: Negative for neck pain and neck stiffness.  Skin: Negative for pallor and rash.  Neurological: Negative.     Allergies  Review of patient's allergies indicates no known allergies.  Home Medications   Prior to Admission medications   Medication Sig Start Date End Date Taking? Authorizing Provider  albuterol (PROVENTIL HFA;VENTOLIN HFA) 108 (90 BASE) MCG/ACT inhaler Inhale 2 puffs into the lungs every 4 (four) hours as needed for wheezing or shortness of breath. 09/25/13   Raymon Muttonyan M Dunn, PA-C  amoxicillin (AMOXIL) 500 MG capsule Take 1 capsule (500 mg total) by mouth 3 (three)  times daily. 12/27/14   Hayden Rasmussenavid Dalyn Kjos, NP  fluconazole (DIFLUCAN) 150 MG tablet Take 1 tablet (150 mg total) by mouth once. Repeat in 3 days as needed. 11/25/14   Charm RingsErin J Honig, MD  HYDROcodone-homatropine [facility](HYCODAN) 5-1.5 MG/5ML syrup 1 TSP PO Q 4-6 HOURS PRN COUGH 09/25/13   Raymon Muttonyan M Dunn, PA-C  ipratropium (ATROVENT) 0.06 % nasal spray Place 2 sprays into both nostrils 4 (four) times daily. 12/27/14   Hayden Rasmussenavid Kamber Vignola, NP  predniSONE (DELTASONE) 20 MG tablet Take 3 tabs po on first day, 2 tabs second day, 2 tabs third day, 1 tab fourth day, 1 tab 5th day. Take with food. 12/27/14   Hayden Rasmussenavid Fantasy Donald, NP  Spacer/Aero-Holding Chambers (E-Z SPACER) inhaler Use as instructed 09/25/13   Raymon Muttonyan M Dunn, PA-C   BP 155/63 mmHg  Pulse 89  Temp(Src) 99 F (37.2 C) (Oral)  Resp 20  SpO2 99%  LMP 05/22/2013 Physical Exam  Constitutional: She is oriented to person, place, and time. She appears well-developed and well-nourished. No distress.  HENT:  Mouth/Throat: Oropharyngeal exudate present.  Bilateral TMs retracted. Minor erythema to the left TM. No effusion seen. Oropharynx with enlarged bilateral palatine tonsils, erythematous with multiple white exudates.  Eyes: EOM are normal. Pupils are equal, round, and reactive to light.  Neck: Normal range of motion. Neck supple.  Cardiovascular: Normal rate, regular rhythm and normal heart sounds.   Pulmonary/Chest: Effort normal and breath sounds normal. No respiratory distress.  Musculoskeletal: Normal range of motion. She exhibits no edema.  Lymphadenopathy:    She has cervical adenopathy.  Neurological: She is alert and oriented to person, place, and time.  Skin: Skin is warm and dry. No rash noted.  Psychiatric: She has a normal mood and affect.  Nursing note and vitals reviewed.   ED Course  Procedures (including critical care time) Labs Review Labs Reviewed  POCT RAPID STREP A ([facility] ONLY)   Results for orders placed or performed during the hospital encounter  of 12/27/14  POCT rapid strep A [facility])  Result Value Ref Range   Streptococcus, Group A Screen (Direct) NEGATIVE NEGATIVE     Imaging Review No results found.   MDM   1. Exudative tonsillitis   2. Allergic rhinitis due to pollen    Consider strep due to lymphadenopathy with exudative tonsillitis. Tx with amoxicillin course atrovent nasal spray flonase nasal spray Use lots of saline nasal spray Sudafed PE 10 mg for congestion Allegra 180 mg for drainage and if need stronger medicine may take Chlor-Trimeton 2 mg, but can cause drowsiness.     Hayden Rasmussen, NP 12/27/14 2109

## 2014-12-29 LAB — CULTURE, GROUP A STREP: STREP A CULTURE: POSITIVE — AB

## 2014-12-30 ENCOUNTER — Telehealth (HOSPITAL_COMMUNITY): Payer: Self-pay

## 2014-12-30 NOTE — ED Notes (Signed)
Throat culture: Group A Strep.  I called pt. Pt. verified x 2 and given results.  Pt. told she was adequately treated with Amoxicillin and to finish all to the medication. If not better, to get rechecked.  If anyone she exposed, gets the same symptoms, should get checked also. Pt. had no questions. Vassie MoselleYork, Giannah Zavadil M 12/30/2014

## 2014-12-30 NOTE — Telephone Encounter (Signed)
Post ED Visit - Positive Culture Follow-up  Culture report reviewed by antimicrobial stewardship pharmacist: []  Wes Dulaney, Pharm.D., BCPS [x]  Celedonio MiyamotoJeremy Frens, Pharm.D., BCPS []  Georgina PillionElizabeth Martin, Pharm.D., BCPS []  EmeradoMinh Pham, VermontPharm.D., BCPS, AAHIVP []  Estella HuskMichelle Turner, Pharm.D., BCPS, AAHIVP []  Elder CyphersLorie Poole, [address] BoulevardPharm.D., BCP  Positive Throat culture, -> Group A Strep Treated with Amoxcillin , organism sensitive to the same and no further patient follow-up is required at this time.  Arvid RightClark, [place] Taber Dorn 12/30/2014, 7:16 PM

## 2015-02-12 ENCOUNTER — Emergency Department (HOSPITAL_COMMUNITY)
Admission: EM | Admit: 2015-02-12 | Discharge: 2015-02-12 | Disposition: A | Discharge status: Home / Self Care | Payer: PRIVATE HEALTH INSURANCE | Attending: Emergency Medicine | Admitting: Emergency Medicine

## 2015-02-12 ENCOUNTER — Encounter (HOSPITAL_COMMUNITY): Payer: Self-pay | Admitting: Family Medicine

## 2015-02-12 ENCOUNTER — Emergency Department (HOSPITAL_COMMUNITY): Payer: PRIVATE HEALTH INSURANCE

## 2015-02-12 DIAGNOSIS — Z79899 Other long term (current) drug therapy: Secondary | ICD-10-CM | POA: Insufficient documentation

## 2015-02-12 DIAGNOSIS — Y9389 Activity, other specified: Secondary | ICD-10-CM | POA: Insufficient documentation

## 2015-02-12 DIAGNOSIS — Y998 Other external cause status: Secondary | ICD-10-CM | POA: Insufficient documentation

## 2015-02-12 DIAGNOSIS — Y9289 Other specified places as the place of occurrence of the external cause: Secondary | ICD-10-CM | POA: Diagnosis not present

## 2015-02-12 DIAGNOSIS — S6000XA Contusion of unspecified finger without damage to nail, initial encounter: Secondary | ICD-10-CM

## 2015-02-12 DIAGNOSIS — S6991XA Unspecified injury of right wrist, hand and finger(s), initial encounter: Secondary | ICD-10-CM | POA: Diagnosis present

## 2015-02-12 DIAGNOSIS — T148 Other injury of unspecified body region: Secondary | ICD-10-CM | POA: Diagnosis not present

## 2015-02-12 DIAGNOSIS — S60221A Contusion of right hand, initial encounter: Secondary | ICD-10-CM | POA: Diagnosis not present

## 2015-02-12 DIAGNOSIS — Z792 Long term (current) use of antibiotics: Secondary | ICD-10-CM | POA: Insufficient documentation

## 2015-02-12 DIAGNOSIS — Z720 Tobacco use: Secondary | ICD-10-CM | POA: Diagnosis not present

## 2015-02-12 DIAGNOSIS — W230XXA Caught, crushed, jammed, or pinched between moving objects, initial encounter: Secondary | ICD-10-CM | POA: Insufficient documentation

## 2015-02-12 MED ORDER — HYDROCODONE-ACETAMINOPHEN 5-325 MG PO TABS: 2.0000 | ORAL_TABLET | ORAL | Status: DC | PRN

## 2015-02-12 MED ORDER — OXYCODONE-ACETAMINOPHEN 5-325 MG PO TABS
1.0000 | ORAL_TABLET | Freq: Once | ORAL | Status: AC
  Administered 2015-02-12: 1 via ORAL
  Filled 2015-02-12: qty 1

## 2015-02-12 NOTE — ED Notes (Signed)
Pt here for right hand injury. sts closed in a car door. Pt hand swollen.

## 2015-02-12 NOTE — Discharge Instructions (Signed)
Contusion °A contusion is a deep bruise. Contusions are the result of an injury that caused bleeding under the skin. The contusion may turn blue, purple, or yellow. Minor injuries will give you a painless contusion, but more severe contusions may stay painful and swollen for a few weeks.  °CAUSES  °A contusion is usually caused by a blow, trauma, or direct force to an area of the body. °SYMPTOMS  °· Swelling and redness of the injured area. °· Bruising of the injured area. °· Tenderness and soreness of the injured area. °· Pain. °DIAGNOSIS  °The diagnosis can be made by taking a history and physical exam. An X-ray, CT scan, or MRI may be needed to determine if there were any associated injuries, such as fractures. °TREATMENT  °Specific treatment will depend on what area of the body was injured. In general, the best treatment for a contusion is resting, icing, elevating, and applying cold compresses to the injured area. Over-the-counter medicines may also be recommended for pain control. Ask your caregiver what the best treatment is for your contusion. °HOME CARE INSTRUCTIONS  °· Put ice on the injured area. °¨ Put ice in a plastic bag. °¨ Place a towel between your skin and the bag. °¨ Leave the ice on for 15-20 minutes, 3-4 times a day, or as directed by your health care provider. °· Only take over-the-counter or prescription medicines for pain, discomfort, or fever as directed by your caregiver. Your caregiver may recommend avoiding anti-inflammatory medicines (aspirin, ibuprofen, and naproxen) for 48 hours because these medicines may increase bruising. °· Rest the injured area. °· If possible, elevate the injured area to reduce swelling. °SEEK IMMEDIATE MEDICAL CARE IF:  °· You have increased bruising or swelling. °· You have pain that is getting worse. °· Your swelling or pain is not relieved with medicines. °MAKE SURE YOU:  °· Understand these instructions. °· Will watch your condition. °· Will get help right  away if you are not doing well or get worse. °Document Released: 05/26/2005 Document Revised: 08/21/2013 Document Reviewed: 06/21/2011 °ExitCare® Patient Information ©2015 [employer], [employer]. This information is not intended to replace advice given to you by your health care provider. Make sure you discuss any questions you have with your health care provider. ° °

## 2015-02-12 NOTE — ED Provider Notes (Signed)
CSN: [id number]     Arrival date & time 02/12/15  1348 History  This chart was scribed for non-physician practitioner, Cheron Schaumann, PA-C working with Pricilla Loveless, MD by Placido Sou, ED scribe. This patient was seen in room TR08C/TR08C and the patient's care was started at 2:35 PM.      Chief Complaint  Patient presents with  . Hand Pain    The history is provided by the patient. No language interpreter was used.    HPI Comments: Haley Erickson is a 54 y.o. female who presents to the Emergency Department complaining of generalized, constant, moderate, pain to her right hand with onset PTA. Pt notes her daughter accidentally slammed a car door on the affected hand. She notes swelling and tenderness as associated symptoms and further notes a worsening of symptoms when ambulating the affected hand. She denies a history of HTN and DM as well as any other recent trauma. Pt denies any other associated symptoms.   History reviewed. No pertinent past medical history. Past Surgical History  Procedure Laterality Date  . Tubal ligation     Family History  Problem Relation Age of Onset  . Diabetes Brother    History  Substance Use Topics  . Smoking status: Current Some Day Smoker -- 0.25 packs/day    Types: Cigarettes  . Smokeless tobacco: Not on file  . Alcohol Use: No     Comment: occcasional   OB History    No data available     Review of Systems  Musculoskeletal: Positive for myalgias, joint swelling and arthralgias.  Skin: Negative for wound.  All other systems reviewed and are negative.     Allergies  Review of patient's allergies indicates no known allergies.  Home Medications   Prior to Admission medications   Medication Sig Start Date End Date Taking? Authorizing Provider  albuterol (PROVENTIL HFA;VENTOLIN HFA) 108 (90 BASE) MCG/ACT inhaler Inhale 2 puffs into the lungs every 4 (four) hours as needed for wheezing or shortness of breath. 09/25/13   Raymon Mutton Dunn,  PA-C  amoxicillin (AMOXIL) 500 MG capsule Take 1 capsule (500 mg total) by mouth 3 (three) times daily. 12/27/14   Hayden Rasmussen, NP  fluconazole (DIFLUCAN) 150 MG tablet Take 1 tablet (150 mg total) by mouth once. Repeat in 3 days as needed. 11/25/14   Charm Rings, MD  HYDROcodone-homatropine [facility]) 5-1.5 MG/5ML syrup 1 TSP PO Q 4-6 HOURS PRN COUGH 09/25/13   Raymon Mutton Dunn, PA-C  ipratropium (ATROVENT) 0.06 % nasal spray Place 2 sprays into both nostrils 4 (four) times daily. 12/27/14   Hayden Rasmussen, NP  predniSONE (DELTASONE) 20 MG tablet Take 3 tabs po on first day, 2 tabs second day, 2 tabs third day, 1 tab fourth day, 1 tab 5th day. Take with food. 12/27/14   Hayden Rasmussen, NP  Spacer/Aero-Holding Chambers (E-Z SPACER) inhaler Use as instructed 09/25/13   Raymon Mutton Dunn, PA-C   BP 118/98 mmHg  Pulse 87  Temp(Src) 98.3 F (36.8 C)  Resp 18  SpO2 99%  LMP 05/22/2013 Physical Exam  Constitutional: She is oriented to person, place, and time. She appears well-developed and well-nourished. No distress.  HENT:  Head: Normocephalic and atraumatic.  Mouth/Throat: Oropharynx is clear and moist.  Eyes: Conjunctivae and EOM are normal. Pupils are equal, round, and reactive to light.  Neck: Normal range of motion. Neck supple. No tracheal deviation present.  Cardiovascular: Normal rate.   Pulmonary/Chest: Breath sounds normal. No respiratory distress.  Abdominal: Soft.  Musculoskeletal: Normal range of motion. She exhibits edema and tenderness.       Right hand: She exhibits tenderness. She exhibits normal range of motion.  Swelling along area of 4th and 5th metacarpals; good ROM; swelling to 4th finger;  Neurological: She is alert and oriented to person, place, and time.  Skin: Skin is warm and dry.  Psychiatric: She has a normal mood and affect. Her behavior is normal.  Nursing note and vitals reviewed.   ED Course  Procedures  DIAGNOSTIC STUDIES: Oxygen Saturation is 99% on RA, normal by my  interpretation.    COORDINATION OF CARE: 2:37 PM Discussed treatment plan with pt at bedside and pt agreed to plan.  Labs Review Labs Reviewed - No data to display  Imaging Review No results found.   EKG Interpretation None      MDM   Final diagnoses:  Contusion of right hand including fingers, initial encounter    Ace wrap Hydrocodone Follow up with Dr. Melvyn Novas if pain persist past one week   I personally performed the services in this documentation, which was scribed in my presence.  The recorded information has been reviewed and considered.   Barnet Pall.Elson Areas, PA-C 02/12/15 1447  Lonia Skinner [place], PA-C 02/12/15 1447  Pricilla Loveless, MD 02/13/15 [phone]

## 2015-04-10 ENCOUNTER — Emergency Department (HOSPITAL_COMMUNITY)
Admission: EM | Admit: 2015-04-10 | Discharge: 2015-04-10 | Disposition: A | Discharge status: Home / Self Care | Payer: PRIVATE HEALTH INSURANCE | Attending: Emergency Medicine | Admitting: Emergency Medicine

## 2015-04-10 ENCOUNTER — Encounter (HOSPITAL_COMMUNITY): Payer: Self-pay | Admitting: Emergency Medicine

## 2015-04-10 DIAGNOSIS — Z79899 Other long term (current) drug therapy: Secondary | ICD-10-CM | POA: Insufficient documentation

## 2015-04-10 DIAGNOSIS — H9209 Otalgia, unspecified ear: Secondary | ICD-10-CM | POA: Insufficient documentation

## 2015-04-10 DIAGNOSIS — Z720 Tobacco use: Secondary | ICD-10-CM | POA: Insufficient documentation

## 2015-04-10 DIAGNOSIS — J020 Streptococcal pharyngitis: Secondary | ICD-10-CM | POA: Insufficient documentation

## 2015-04-10 LAB — RAPID STREP SCREEN (MED CTR MEBANE ONLY): Streptococcus, Group A Screen (Direct): POSITIVE — AB

## 2015-04-10 MED ORDER — AMOXICILLIN 500 MG PO CAPS
500.0000 mg | ORAL_CAPSULE | Freq: Once | ORAL | Status: AC
  Administered 2015-04-10: 500 mg via ORAL
  Filled 2015-04-10: qty 1

## 2015-04-10 MED ORDER — DEXAMETHASONE SODIUM PHOSPHATE 10 MG/ML IJ SOLN
10.0000 mg | Freq: Once | INTRAMUSCULAR | Status: AC
  Administered 2015-04-10: 10 mg via INTRAMUSCULAR
  Filled 2015-04-10: qty 1

## 2015-04-10 MED ORDER — IBUPROFEN 400 MG PO TABS
800.0000 mg | ORAL_TABLET | Freq: Once | ORAL | Status: AC
  Administered 2015-04-10: 800 mg via ORAL
  Filled 2015-04-10: qty 2

## 2015-04-10 MED ORDER — AMOXICILLIN 500 MG PO CAPS: 500.0000 mg | ORAL_CAPSULE | Freq: Three times a day (TID) | ORAL | Status: DC

## 2015-04-10 NOTE — ED Provider Notes (Signed)
CSN: [id number]     Arrival date & time 04/10/15  1149 History   First MD Initiated Contact with Patient 04/10/15 1219     Chief Complaint  Patient presents with  . Sore Throat  . Otalgia     (Consider location/radiation/quality/duration/timing/severity/associated sxs/prior Treatment) HPI Haley Erickson is a 54 y.o. female with no pertinent medical problems presents to emergency department complaining of sore throat, fever, bilateral earache, chills. Patient states symptoms started yesterday. Patient reports taking over-the-counter cold medications with no relief, last taken yesterday. She states she went to work today and was sent here. Patient denies any nasal congestion, no cough. History of strep throat. Nothing making her symptoms better or worse. States painful to swallow, no difficulty swallowing.   History reviewed. No pertinent past medical history. Past Surgical History  Procedure Laterality Date  . Tubal ligation     Family History  Problem Relation Age of Onset  . Diabetes Brother    Social History  Substance Use Topics  . Smoking status: Current Some Day Smoker -- 0.25 packs/day    Types: Cigarettes  . Smokeless tobacco: None  . Alcohol Use: No     Comment: occcasional   OB History    No data available     Review of Systems  Constitutional: Negative for fever and chills.  HENT: Positive for sore throat. Negative for congestion, trouble swallowing and voice change.   Respiratory: Negative for cough, chest tightness and shortness of breath.   Cardiovascular: Negative for chest pain, palpitations and leg swelling.  Musculoskeletal: Negative for myalgias, arthralgias, neck pain and neck stiffness.  Skin: Negative for rash.  Neurological: Negative for dizziness, weakness and headaches.  All other systems reviewed and are negative.     Allergies  Review of patient's allergies indicates no known allergies.  Home Medications   Prior to Admission  medications   Medication Sig Start Date End Date Taking? Authorizing Provider  albuterol (PROVENTIL HFA;VENTOLIN HFA) 108 (90 BASE) MCG/ACT inhaler Inhale 2 puffs into the lungs every 4 (four) hours as needed for wheezing or shortness of breath. 09/25/13   Raymon Mutton Dunn, PA-C  amoxicillin (AMOXIL) 500 MG capsule Take 1 capsule (500 mg total) by mouth 3 (three) times daily. 12/27/14   Hayden Rasmussen, NP  fluconazole (DIFLUCAN) 150 MG tablet Take 1 tablet (150 mg total) by mouth once. Repeat in 3 days as needed. 11/25/14   Charm Rings, MD  HYDROcodone-acetaminophen (NORCO/VICODIN) 5-325 MG per tablet Take 2 tablets by mouth every 4 (four) hours as needed. 02/12/15   Elson Areas, PA-C  HYDROcodone-homatropine [facility]) 5-1.5 MG/5ML syrup 1 TSP PO Q 4-6 HOURS PRN COUGH 09/25/13   Raymon Mutton Dunn, PA-C  ipratropium (ATROVENT) 0.06 % nasal spray Place 2 sprays into both nostrils 4 (four) times daily. 12/27/14   Hayden Rasmussen, NP  predniSONE (DELTASONE) 20 MG tablet Take 3 tabs po on first day, 2 tabs second day, 2 tabs third day, 1 tab fourth day, 1 tab 5th day. Take with food. 12/27/14   Hayden Rasmussen, NP  Spacer/Aero-Holding Deretha Emory (E-Z SPACER) inhaler Use as instructed 09/25/13   Raymon Mutton Dunn, PA-C   BP 125/63 mmHg  Pulse 99  Temp(Src) 99.9 F (37.7 C) (Oral)  Ht 5\' 7"  (1.702 m)  Wt 224 lb (101.606 kg)  BMI 35.08 kg/m2  SpO2 98%  LMP 05/22/2013 Physical Exam  Constitutional: She is oriented to person, place, and time. She appears well-developed and well-nourished. No distress.  HENT:  Head: Normocephalic.  Bilaterally enlarged tonsils. Erythematous. Exudate present. Uvula midline.    Eyes: Conjunctivae are normal.  Neck: Neck supple.  Anterior tender cervical lymphadenopathy  Cardiovascular: Normal rate, regular rhythm and normal heart sounds.   Pulmonary/Chest: Effort normal and breath sounds normal. No respiratory distress. She has no wheezes. She has no rales.  Musculoskeletal: She exhibits no edema.   Neurological: She is alert and oriented to person, place, and time.  Skin: Skin is warm and dry.  Psychiatric: She has a normal mood and affect. Her behavior is normal.  Nursing note and vitals reviewed.   ED Course  Procedures (including critical care time) Labs Review Labs Reviewed  RAPID STREP SCREEN (NOT AT [facility]) - Abnormal; Notable for the following:    Streptococcus, Group A Screen (Direct) POSITIVE (*)    All other components within normal limits    Imaging Review No results found.   EKG Interpretation None      MDM   Final diagnoses:  Strep throat      patient with sore throat since yesterday, low-grade temperature here 99.9, Motrin given. Uvula is midline, no evidence of peritonsillar abscess. No other associated symptoms.  Strep screen positive. Will treat with Decadron 10 mg IM and with amoxicillin. Instructed to do salt water gargles. Follow-up as needed.   Filed Vitals:   04/10/15 1156 04/10/15 1200 04/10/15 1421  BP: 125/63  126/68  Pulse: 100 99 77  Temp: 99.9 F (37.7 C)  99.1 F (37.3 C)  TempSrc: Oral  Oral  Resp:   18  Height:  (1.702 m)    Weight: 224 lb (101.606 kg)    SpO2: 99% 98% 100%     Jaynie Crumble, PA-C 04/10/15 1534  Vanetta Mulders, MD 04/16/15 1749

## 2015-04-10 NOTE — ED Notes (Signed)
Pt given discharge instructions and pt verbalized understanding. V S NAD upon discharge. Pt ambulatory to discharge area without difficulty.

## 2015-04-10 NOTE — Discharge Instructions (Signed)
Take ibuprofen or Tylenol for pain and for the fever. Take amoxicillin as prescribed until all gone. Salt water gargles multiple times a day. Rest. Drink plenty of fluids to prevent dehydration. Follow-up with your primary care doctor as needed.  Strep Throat Strep throat is an infection of the throat caused by a bacteria named Streptococcus pyogenes. Your health care provider may call the infection streptococcal "tonsillitis" or "pharyngitis" depending on whether there are signs of inflammation in the tonsils or back of the throat. Strep throat is most common in children aged 5-15 years during the cold months of the year, but it can occur in people of any age during any season. This infection is spread from person to person (contagious) through coughing, sneezing, or other close contact. SIGNS AND SYMPTOMS   Fever or chills.  Painful, swollen, red tonsils or throat.  Pain or difficulty when swallowing.  White or yellow spots on the tonsils or throat.  Swollen, tender lymph nodes or "glands" of the neck or under the jaw.  Red rash all over the body (rare). DIAGNOSIS  Many different infections can cause the same symptoms. A test must be done to confirm the diagnosis so the right treatment can be given. A "rapid strep test" can help your health care provider make the diagnosis in a few minutes. If this test is not available, a light swab of the infected area can be used for a throat culture test. If a throat culture test is done, results are usually available in a day or two. TREATMENT  Strep throat is treated with antibiotic medicine. HOME CARE INSTRUCTIONS   Gargle with 1 tsp of salt in 1 cup of warm water, 3-4 times per day or as needed for comfort.  Family members who also have a sore throat or fever should be tested for strep throat and treated with antibiotics if they have the strep infection.  Make sure everyone in your household washes their hands well.  Do not share food, drinking  cups, or personal items that could cause the infection to spread to others.  You may need to eat a soft food diet until your sore throat gets better.  Drink enough water and fluids to keep your urine clear or pale yellow. This will help prevent dehydration.  Get plenty of rest.  Stay home from school, day care, or work until you have been on antibiotics for 24 hours.  Take medicines only as directed by your health care provider.  Take your antibiotic medicine as directed by your health care provider. Finish it even if you start to feel better. SEEK MEDICAL CARE IF:   The glands in your neck continue to enlarge.  You develop a rash, cough, or earache.  You cough up green, yellow-brown, or bloody sputum.  You have pain or discomfort not controlled by medicines.  Your problems seem to be getting worse rather than better.  You have a fever. SEEK IMMEDIATE MEDICAL CARE IF:   You develop any new symptoms such as vomiting, severe headache, stiff or painful neck, chest pain, shortness of breath, or trouble swallowing.  You develop severe throat pain, drooling, or changes in your voice.  You develop swelling of the neck, or the skin on the neck becomes red and tender.  You develop signs of dehydration, such as fatigue, dry mouth, and decreased urination.  You become increasingly sleepy, or you cannot wake up completely. MAKE SURE YOU:  Understand these instructions.  Will watch your condition.  Will get help right away if you are not doing well or get worse. Document Released: 08/13/2000 Document Revised: 12/31/2013 Document Reviewed: 10/15/2010 [facility] Patient Information 2015 [place], [place]. This information is not intended to replace advice given to you by your health care provider. Make sure you discuss any questions you have with your health care provider.

## 2015-04-10 NOTE — ED Notes (Signed)
Fever, sore throat, bilateral earache started yesterday  With chills. Was sent home from work today-- works at [employer].

## 2015-08-26 ENCOUNTER — Emergency Department (HOSPITAL_COMMUNITY)
Admission: EM | Admit: 2015-08-26 | Discharge: 2015-08-26 | Disposition: A | Discharge status: Home / Self Care | Payer: PRIVATE HEALTH INSURANCE | Attending: Emergency Medicine | Admitting: Emergency Medicine

## 2015-08-26 ENCOUNTER — Encounter (HOSPITAL_COMMUNITY): Payer: Self-pay | Admitting: Emergency Medicine

## 2015-08-26 ENCOUNTER — Emergency Department (HOSPITAL_COMMUNITY): Payer: PRIVATE HEALTH INSURANCE

## 2015-08-26 DIAGNOSIS — Z79899 Other long term (current) drug therapy: Secondary | ICD-10-CM | POA: Diagnosis not present

## 2015-08-26 DIAGNOSIS — F17210 Nicotine dependence, cigarettes, uncomplicated: Secondary | ICD-10-CM | POA: Diagnosis not present

## 2015-08-26 DIAGNOSIS — Z7952 Long term (current) use of systemic steroids: Secondary | ICD-10-CM | POA: Insufficient documentation

## 2015-08-26 DIAGNOSIS — Z792 Long term (current) use of antibiotics: Secondary | ICD-10-CM | POA: Diagnosis not present

## 2015-08-26 DIAGNOSIS — M25561 Pain in right knee: Secondary | ICD-10-CM | POA: Diagnosis not present

## 2015-08-26 MED ORDER — HYDROCODONE-ACETAMINOPHEN 5-325 MG PO TABS: 2.0000 | ORAL_TABLET | ORAL | Status: DC | PRN

## 2015-08-26 MED ORDER — NAPROXEN 500 MG PO TABS: 500.0000 mg | ORAL_TABLET | Freq: Two times a day (BID) | ORAL | Status: DC

## 2015-08-26 NOTE — ED Provider Notes (Signed)
CSN: [id number]     Arrival date & time 08/26/15  1406 History  By signing my name below, I, Ronney Lion, attest that this documentation has been prepared under the direction and in the presence of Denyse Fillion, PA-C.  Electronically Signed: Ronney Lion, ED Scribe. 08/26/2015. 4:00 PM.     Chief Complaint  Patient presents with  . Knee Pain   The history is provided by the patient. No language interpreter was used.    HPI Comments: Haley Erickson is a 54 y.o. female who presents to the Emergency Department complaining of atraumatic, constant, gradually worsening, 8/10 right knee pain and associated that began 3-4 days ago while patient was doing housework. She denies any trauma or injury. Bending her knee exacerbates her pain. 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 She also reports she has mechanically fallen on her right knee in the distant past, but the pain and swelling today is a new problem. She denies a history of any right knee surgeries or paracenteses. She denies a history of gout, toe swelling, or any other spontaneous joint swelling, although she does state she frequently eats seafood. She denies fever or chills.   History reviewed. No pertinent past medical history. Past Surgical History  Procedure Laterality Date  . Tubal ligation     Family History  Problem Relation Age of Onset  . Diabetes Brother    Social History  Substance Use Topics  . Smoking status: Current Some Day Smoker -- 0.25 packs/day    Types: Cigarettes  . Smokeless tobacco: None  . Alcohol Use: No     Comment: occcasional   OB History    No data available     Review of Systems  Constitutional: Negative for fever and chills.  Musculoskeletal: Positive for joint swelling and arthralgias (right knee pain).   Allergies  Review of patient's allergies indicates no known allergies.  Home Medications   Prior to Admission medications   Medication Sig Start Date End  Date Taking? Authorizing Provider  albuterol (PROVENTIL HFA;VENTOLIN HFA) 108 (90 BASE) MCG/ACT inhaler Inhale 2 puffs into the lungs every 4 (four) hours as needed for wheezing or shortness of breath. 09/25/13   Raymon Mutton Dunn, PA-C  amoxicillin (AMOXIL) 500 MG capsule Take 1 capsule (500 mg total) by mouth 3 (three) times daily. 04/10/15   Tatyana Kirichenko, PA-C  fluconazole (DIFLUCAN) 150 MG tablet Take 1 tablet (150 mg total) by mouth once. Repeat in 3 days as needed. 11/25/14   Charm Rings, MD  HYDROcodone-acetaminophen (NORCO/VICODIN) 5-325 MG per tablet Take 2 tablets by mouth every 4 (four) hours as needed. 02/12/15   Elson Areas, PA-C  HYDROcodone-homatropine [facility]) 5-1.5 MG/5ML syrup 1 TSP PO Q 4-6 HOURS PRN COUGH 09/25/13   Raymon Mutton Dunn, PA-C  ipratropium (ATROVENT) 0.06 % nasal spray Place 2 sprays into both nostrils 4 (four) times daily. 12/27/14   Hayden Rasmussen, NP  predniSONE (DELTASONE) 20 MG tablet Take 3 tabs po on first day, 2 tabs second day, 2 tabs third day, 1 tab fourth day, 1 tab 5th day. Take with food. 12/27/14   Hayden Rasmussen, NP  Spacer/Aero-Holding Deretha Emory (E-Z SPACER) inhaler Use as instructed 09/25/13   Sondra Barges, PA-C   BP 112/95 mmHg  Pulse 77  Temp(Src) 98 F (36.7 C) (Oral)  Resp 18  Wt 277 lb 7 oz (125.845 kg)  SpO2 100%  LMP 05/22/2013 Physical Exam  Constitutional: She  is oriented to person, place, and time. She appears well-developed and well-nourished. No distress.  HENT:  Head: Normocephalic and atraumatic.  Eyes: Conjunctivae are normal. Right eye exhibits no discharge. Left eye exhibits no discharge. No scleral icterus.  Cardiovascular: Normal rate.   Pulmonary/Chest: Effort normal.  Musculoskeletal:  Negative anterior/poster drawer bilaterally. Negative ballottement test. No varus or valgus laxity. No crepitus. Pain felt with flexion of R knee. Mild edema over anterior aspect of R knee over patella. No obvious bony deformity. No redness or warmth.   Neurological: She is alert and oriented to person, place, and time. Coordination normal.  Strength 5/5 throughout. No sensory deficits.  No gait abnormality  Skin: Skin is warm and dry. No rash noted. She is not diaphoretic. No erythema. No pallor.  Psychiatric: She has a normal mood and affect. Her behavior is normal.  Nursing note and vitals reviewed.   ED Course  Procedures (including critical care time)  DIAGNOSTIC STUDIES: Oxygen Saturation is 100% on RA, normal by my interpretation.    COORDINATION OF CARE: 3:53 PM - Discussed treatment plan with pt at bedside. Pt verbalized understanding and agreed to plan.   Imaging Review Dg Knee Complete 4 Views Right  08/26/2015  CLINICAL DATA:  Acute right knee pain without known injury. EXAM: RIGHT KNEE - COMPLETE 4+ VIEW COMPARISON:  April 22, 2008. FINDINGS: There is no evidence of fracture, dislocation, or joint effusion. Minimal narrowing of medial joint space is noted with osteophyte formation. Soft tissues are unremarkable. 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 No acute abnormality seen in the right knee. Electronically Signed   By: Lupita RaiderJames  Green Jr, M.D.   On: 08/26/2015 15:51   I have personally reviewed and evaluated these images and lab results as part of my medical decision-making.  MDM   Final diagnoses:  Right knee pain   Patient X-Ray negative for obvious fracture or dislocation. No redness or warmth. Do not suspect septic joint.  Pt ambulatory in ED. Pt advised to follow up with orthopedics. Patient given brace while in ED, conservative therapy recommended and discussed. Will give work note for Patient will be dc home & is agreeable with above plan.   I personally performed the services described in this documentation, which was scribed in my presence. The recorded information has been reviewed and is accurate.       Lester KinsmanSamantha Tripp JacksonDowless, PA-C 08/26/15 1636  Eber HongBrian Miller, MD 08/30/15  [phone]

## 2015-08-26 NOTE — ED Notes (Signed)
Pt c/o pain in right knee x's 3-4 days.  Pt denies any injury.  St's years ago she did have fluid on right knee.  Right knee slightly swollen at this time

## 2015-08-26 NOTE — Discharge Instructions (Signed)
Cryotherapy °Cryotherapy means treatment with cold. Ice or gel packs can be used to reduce both pain and swelling. Ice is the most helpful within the first 24 to 48 hours after an injury or flare-up from overusing a muscle or joint. Sprains, strains, spasms, burning pain, shooting pain, and aches can all be eased with ice. Ice can also be used when recovering from surgery. Ice is effective, has very few side effects, and is safe for most people to use. °PRECAUTIONS  °Ice is not a safe treatment option for people with: °· Raynaud phenomenon. This is a condition affecting small blood vessels in the extremities. Exposure to cold may cause your problems to return. °· Cold hypersensitivity. There are many forms of cold hypersensitivity, including: °· Cold urticaria. Red, itchy hives appear on the skin when the tissues begin to warm after being iced. °· Cold erythema. This is a red, itchy rash caused by exposure to cold. °· Cold hemoglobinuria. Red blood cells break down when the tissues begin to warm after being iced. The hemoglobin that carry oxygen are passed into the urine because they cannot combine with blood proteins fast enough. °· Numbness or altered sensitivity in the area being iced. °If you have any of the following conditions, do not use ice until you have discussed cryotherapy with your caregiver: °· Heart conditions, such as arrhythmia, angina, or chronic heart disease. °· High blood pressure. °· Healing wounds or open skin in the area being iced. °· Current infections. °· Rheumatoid arthritis. °· Poor circulation. °· Diabetes. °Ice slows the blood flow in the region it is applied. This is beneficial when trying to stop inflamed tissues from spreading irritating chemicals to surrounding tissues. However, if you expose your skin to cold temperatures for too long or without the proper protection, you can damage your skin or nerves. Watch for signs of skin damage due to cold. °HOME CARE INSTRUCTIONS °Follow  these tips to use ice and cold packs safely. °· Place a dry or damp towel between the ice and skin. A damp towel will cool the skin more quickly, so you may need to shorten the time that the ice is used. °· For a more rapid response, add gentle compression to the ice. °· Ice for no more than 10 to 20 minutes at a time. The bonier the area you are icing, the less time it will take to get the benefits of ice. °· Check your skin after 5 minutes to make sure there are no signs of a poor response to cold or skin damage. °· Rest 20 minutes or more between uses. °· Once your skin is numb, you can end your treatment. You can test numbness by very lightly touching your skin. The touch should be so light that you do not see the skin dimple from the pressure of your fingertip. When using ice, most people will feel these normal sensations in this order: cold, burning, aching, and numbness. °· Do not use ice on someone who cannot communicate their responses to pain, such as small children or people with dementia. °HOW TO MAKE AN ICE PACK °Ice packs are the most common way to use ice therapy. Other methods include ice massage, ice baths, and cryosprays. Muscle creams that cause a cold, tingly feeling do not offer the same benefits that ice offers and should not be used as a substitute unless recommended by your caregiver. °To make an ice pack, do one of the following: °· Place crushed ice or a   bag of frozen vegetables in a sealable plastic bag. Squeeze out the excess air. Place this bag inside another plastic bag. Slide the bag into a pillowcase or place a damp towel between your skin and the bag.  Mix 3 parts water with 1 part rubbing alcohol. Freeze the mixture in a sealable plastic bag. When you remove the mixture from the freezer, it will be slushy. Squeeze out the excess air. Place this bag inside another plastic bag. Slide the bag into a pillowcase or place a damp towel between your skin and the bag. SEEK MEDICAL CARE  IF:  You develop white spots on your skin. This may give the skin a blotchy (mottled) appearance.  Your skin turns blue or pale.  Your skin becomes waxy or hard.  Your swelling gets worse. MAKE SURE YOU:   Understand these instructions.  Will watch your condition.  Will get help right away if you are not doing well or get worse.   This information is not intended to replace advice given to you by your health care provider. Make sure you discuss any questions you have with your health care provider.   Document Released: 04/12/2011 Document Revised: 09/06/2014 Document Reviewed: 04/12/2011 Else[employer] Interactive Patient Education 2016 [employer]  Knee Pain Knee pain is a very common symptom and can have many causes. Knee pain often goes away when you follow your health care provider's instructions for relieving pain and discomfort at home. However, knee pain can develop into a condition that needs treatment. Some conditions may include:  Arthritis caused by wear and tear (osteoarthritis).  Arthritis caused by swelling and irritation (rheumatoid arthritis or gout).  A cyst or growth in your knee.  An infection in your knee joint.  An injury that will not heal.  Damage, swelling, or irritation of the tissues that support your knee (torn ligaments or tendinitis). If your knee pain continues, additional tests may be ordered to diagnose your condition. Tests may include X-rays or other imaging studies of your knee. You may also need to have fluid removed from your knee. Treatment for ongoing knee pain depends on the cause, but treatment may include:  Medicines to relieve pain or swelling.  Steroid injections in your knee.  Physical therapy.  Surgery. HOME CARE INSTRUCTIONS  Take medicines only as directed by your health care provider.  Rest your knee and keep it raised (elevated) while you are resting.  Do not do things that cause or worsen pain.  Avoid high-impact  activities or exercises, such as running, jumping rope, or doing jumping jacks.  Apply ice to the knee area:  Put ice in a plastic bag.  Place a towel between your skin and the bag.  Leave the ice on for 20 minutes, 2-3 times a day.  Ask your health care provider if you should wear an elastic knee support.  Keep a pillow under your knee when you sleep.  Lose weight if you are overweight. Extra weight can put pressure on your knee.  Do not use any tobacco products, including cigarettes, chewing tobacco, or electronic cigarettes. If you need help quitting, ask your health care provider. Smoking may slow the healing of any bone and joint problems that you may have. SEEK MEDICAL CARE IF:  Your knee pain continues, changes, or gets worse.  You have a fever along with knee pain.  Your knee buckles or locks up.  Your knee becomes more swollen. SEEK IMMEDIATE MEDICAL CARE IF:   Your knee  joint feels hot to the touch.  You have chest pain or trouble breathing.   This information is not intended to replace advice given to you by your health care provider. Make sure you discuss any questions you have with your health care provider.   Follow up with ortho for re-evaluation. Apply ice to affected area. Keep area elevated. Return to the emergency department if you experience severe increase in your pain, increased swelling, redness or warmth to the area, numbness or tingling in your extremity, inability to bear weight on your low.

## 2015-10-08 ENCOUNTER — Other Ambulatory Visit: Payer: Self-pay | Admitting: Sports Medicine

## 2015-10-08 DIAGNOSIS — M25561 Pain in right knee: Secondary | ICD-10-CM

## 2015-10-14 ENCOUNTER — Ambulatory Visit
Admission: RE | Admit: 2015-10-14 | Discharge: 2015-10-14 | Disposition: A | Discharge status: Home / Self Care | Payer: PRIVATE HEALTH INSURANCE | Source: Ambulatory Visit | Attending: Sports Medicine | Admitting: Sports Medicine

## 2015-10-14 DIAGNOSIS — M25561 Pain in right knee: Secondary | ICD-10-CM

## 2016-01-13 ENCOUNTER — Encounter (HOSPITAL_COMMUNITY): Payer: Self-pay | Admitting: Emergency Medicine

## 2016-01-13 ENCOUNTER — Ambulatory Visit (HOSPITAL_COMMUNITY)
Admission: EM | Admit: 2016-01-13 | Discharge: 2016-01-13 | Disposition: A | Discharge status: Home / Self Care | Payer: PRIVATE HEALTH INSURANCE | Attending: Family Medicine | Admitting: Family Medicine

## 2016-01-13 DIAGNOSIS — J0100 Acute maxillary sinusitis, unspecified: Secondary | ICD-10-CM | POA: Diagnosis not present

## 2016-01-13 MED ORDER — AMOXICILLIN 500 MG PO CAPS: 500.0000 mg | ORAL_CAPSULE | Freq: Three times a day (TID) | ORAL | Status: DC

## 2016-01-13 NOTE — ED Notes (Signed)
[facility] with a complaint of right ear pain, a cough and sinus pain and pressure x 5 days.

## 2016-01-13 NOTE — Discharge Instructions (Signed)
Sinusitis, Adult  Sinusitis is redness, soreness, and puffiness (inflammation) of the air pockets in the bones of your face (sinuses). The redness, soreness, and puffiness can cause air and mucus to get trapped in your sinuses. This can allow germs to grow and cause an infection.   HOME CARE    Drink enough fluids to keep your pee (urine) clear or pale yellow.   Use a humidifier in your home.   Run a hot shower to create steam in the bathroom. Sit in the bathroom with the door closed. Breathe in the steam 3-4 times a day.   Put a warm, moist washcloth on your face 3-4 times a day, or as told by your doctor.   Use salt water sprays (saline sprays) to wet the thick fluid in your nose. This can help the sinuses drain.   Only take medicine as told by your doctor.  GET HELP RIGHT AWAY IF:    Your pain gets worse.   You have very bad headaches.   You are sick to your stomach (nauseous).   You throw up (vomit).   You are very sleepy (drowsy) all the time.   Your face is puffy (swollen).   Your vision changes.   You have a stiff neck.   You have trouble breathing.  MAKE SURE YOU:    Understand these instructions.   Will watch your condition.   Will get help right away if you are not doing well or get worse.     This information is not intended to replace advice given to you by your health care provider. Make sure you discuss any questions you have with your health care provider.     Document Released: 02/02/2008 Document Revised: 09/06/2014 Document Reviewed: 03/21/2012  Elsevier Interactive Patient Education 2016 Elsevier Inc.

## 2016-01-13 NOTE — ED Provider Notes (Signed)
CSN: [id number]     Arrival date & time 01/13/16  1614 History   First MD Initiated Contact with Patient 01/13/16 1826     Chief Complaint  Patient presents with  . Facial Pain  . Nasal Congestion  . Otalgia   (Consider location/radiation/quality/duration/timing/severity/associated sxs/prior Treatment) HPI History obtained from patient:  Pt presents with the cc [id number] pain and pressure Duration of symptoms: almost 1 week Treatment prior to arrival:otc meds without relief Context:sudden onset pain, pressure, drainage, thick smelly nasal drainage Other symptoms include:headache, fever Pain score: FAMILY HISTORY: bother with DM SOCIAL HISTORY:smoker  History reviewed. No pertinent past medical history. Past Surgical History  Procedure Laterality Date  . Tubal ligation     Family History  Problem Relation Age of Onset  . Diabetes Brother    Social History  Substance Use Topics  . Smoking status: Current Some Day Smoker -- 0.25 packs/day    Types: Cigarettes  . Smokeless tobacco: None  . Alcohol Use: No     Comment: occcasional   OB History    No data available     Review of Systems ROS +'ve sinus pressure and pain  Denies: HEADACHE, NAUSEA, ABDOMINAL PAIN, CHEST PAIN, CONGESTION, DYSURIA, SHORTNESS OF BREATH  Allergies  Review of patient's allergies indicates no known allergies.  Home Medications   Prior to Admission medications   Medication Sig Start Date End Date Taking? Authorizing Provider  albuterol (PROVENTIL HFA;VENTOLIN HFA) 108 (90 BASE) MCG/ACT inhaler Inhale 2 puffs into the lungs every 4 (four) hours as needed for wheezing or shortness of breath. 09/25/13   Raymon Mutton Dunn, PA-C  amoxicillin (AMOXIL) 500 MG capsule Take 1 capsule (500 mg total) by mouth 3 (three) times daily. 04/10/15   Tatyana Kirichenko, PA-C  amoxicillin (AMOXIL) 500 MG capsule Take 1 capsule (500 mg total) by mouth 3 (three) times daily. 01/13/16   Tharon Aquas, PA  fluconazole  (DIFLUCAN) 150 MG tablet Take 1 tablet (150 mg total) by mouth once. Repeat in 3 days as needed. 11/25/14   Charm Rings, MD  HYDROcodone-acetaminophen (NORCO/VICODIN) 5-325 MG per tablet Take 2 tablets by mouth every 4 (four) hours as needed. 02/12/15   Elson Areas, PA-C  HYDROcodone-acetaminophen (NORCO/VICODIN) 5-325 MG tablet Take 2 tablets by mouth every 4 (four) hours as needed. 08/26/15   Samantha Tripp Dowless, PA-C  HYDROcodone-homatropine (HYCODAN) 5-1.5 MG/5ML syrup 1 TSP PO Q 4-6 HOURS PRN COUGH 09/25/13   Raymon Mutton Dunn, PA-C  ipratropium (ATROVENT) 0.06 % nasal spray Place 2 sprays into both nostrils 4 (four) times daily. 12/27/14   Hayden Rasmussen, NP  naproxen (NAPROSYN) 500 MG tablet Take 1 tablet (500 mg total) by mouth 2 (two) times daily. 08/26/15   Samantha Tripp Dowless, PA-C  predniSONE (DELTASONE) 20 MG tablet Take 3 tabs po on first day, 2 tabs second day, 2 tabs third day, 1 tab fourth day, 1 tab 5th day. Take with food. 12/27/14   Hayden Rasmussen, NP  Spacer/Aero-Holding Deretha Emory (E-Z SPACER) inhaler Use as instructed 09/25/13   Sondra Barges, PA-C   Meds Ordered and Administered this Visit  Medications - No data to display  BP 135/69 mmHg  Pulse 72  Temp(Src) 98.3 F (36.8 C) (Oral)  Resp 12  SpO2 99%  LMP 05/22/2013 No data found.   Physical Exam NURSES NOTES AND VITAL SIGNS REVIEWED. CONSTITUTIONAL: Well developed, well nourished, no acute distress HEENT: normocephalic, atraumatic, maxillary sinuses are tender to palpation, boggy b/l EYES:  Conjunctiva normal NECK:normal ROM, supple, no adenopathy PULMONARY:No respiratory distress, normal effort ABDOMINAL: Soft, ND, NT BS+, No CVAT MUSCULOSKELETAL: Normal ROM of all extremities,  SKIN: warm and dry without rash PSYCHIATRIC: Mood and affect, behavior are normal  ED Course  Procedures (including critical care time)  Labs Review Labs Reviewed - No data to display  Imaging Review No results found.   Visual Acuity  Review  Right Eye Distance:   Left Eye Distance:   Bilateral Distance:    Right Eye Near:   Left Eye Near:    Bilateral Near:       rx amoxil  MDM   1. Acute maxillary sinusitis, recurrence not specified     Patient is reassured that there are no issues that require transfer to higher level of care at this time or additional tests. Patient is advised to continue home symptomatic treatment. Patient is advised that if there are new or worsening symptoms to attend the emergency department, contact primary care provider, or return to UC. Instructions of care provided discharged home in stable condition.    THIS NOTE WAS GENERATED USING A VOICE RECOGNITION SOFTWARE PROGRAM. ALL REASONABLE EFFORTS  WERE MADE TO PROOFREAD THIS DOCUMENT FOR ACCURACY.  I have verbally reviewed the discharge instructions with the patient. A printed AVS was given to the patient.  All questions were answered prior to discharge.      Tharon AquasFrank C Jaquavius Hudler, PA 01/13/16 [phone]

## 2016-05-13 HISTORY — PX: FOOT SURGERY: SHX648

## 2016-09-03 ENCOUNTER — Ambulatory Visit (INDEPENDENT_AMBULATORY_CARE_PROVIDER_SITE_OTHER): Payer: PRIVATE HEALTH INSURANCE | Admitting: Emergency Medicine

## 2016-09-03 VITALS — BP 122/68 | HR 88 | Temp 99.5°F | Ht 67.0 in | Wt 277.8 lb

## 2016-09-03 DIAGNOSIS — J069 Acute upper respiratory infection, unspecified: Secondary | ICD-10-CM | POA: Diagnosis not present

## 2016-09-03 DIAGNOSIS — M791 Myalgia, unspecified site: Secondary | ICD-10-CM

## 2016-09-03 DIAGNOSIS — R05 Cough: Secondary | ICD-10-CM | POA: Insufficient documentation

## 2016-09-03 DIAGNOSIS — R059 Cough, unspecified: Secondary | ICD-10-CM | POA: Insufficient documentation

## 2016-09-03 MED ORDER — HYDROCODONE-ACETAMINOPHEN 5-325 MG PO TABS: 1.0000 | ORAL_TABLET | Freq: Four times a day (QID) | ORAL | 0 refills | Status: AC | PRN

## 2016-09-03 MED ORDER — PROMETHAZINE-DM 6.25-15 MG/5ML PO SYRP: 5.0000 mL | ORAL_SOLUTION | Freq: Three times a day (TID) | ORAL | 0 refills | Status: DC | PRN

## 2016-09-03 MED ORDER — BENZONATATE 200 MG PO CAPS: 200.0000 mg | ORAL_CAPSULE | Freq: Two times a day (BID) | ORAL | 0 refills | Status: DC | PRN

## 2016-09-03 NOTE — Patient Instructions (Addendum)
     IF you received an x-ray today, you will receive an invoice from [facility]. Please contact [facility] at [phone] with questions or concerns regarding your invoice.   IF you received labwork today, you will receive an invoice from [facility]. Please contact [facility] at [phone] with questions or concerns regarding your invoice.   Our billing staff will not be able to assist you with questions regarding bills from these companies.  You will be contacted with the lab results as soon as they are available. The fastest way to get your results is to activate your My Chart account. Instructions are located on the last page of this paperwork. If you have not heard from us regarding the results in 2 weeks, please contact this office.      Upper Respiratory Infection, Adult Most upper respiratory infections (URIs) are caused by a virus. A URI affects the nose, throat, and upper air passages. The most common type of URI is often called "the common cold." Follow these instructions at home:  Take medicines only as told by your doctor.  Gargle warm saltwater or take cough drops to comfort your throat as told by your doctor.  Use a warm mist humidifier or inhale steam from a shower to increase air moisture. This may make it easier to breathe.  Drink enough fluid to keep your pee (urine) clear or pale yellow.  Eat soups and other clear broths.  Have a healthy diet.  Rest as needed.  Go back to work when your fever is gone or your doctor says it is okay.  You may need to stay home longer to avoid giving your URI to others.  You can also wear a face mask and wash your hands often to prevent spread of the virus.  Use your inhaler more if you have asthma.  Do not use any tobacco products, including cigarettes, chewing tobacco, or electronic cigarettes. If you need help quitting, ask your doctor. Contact a doctor if:  You are getting worse, not better.  Your  symptoms are not helped by medicine.  You have chills.  You are getting more short of breath.  You have brown or red mucus.  You have yellow or brown discharge from your nose.  You have pain in your face, especially when you bend forward.  You have a fever.  You have puffy (swollen) neck glands.  You have pain while swallowing.  You have white areas in the back of your throat. Get help right away if:  You have very bad or constant:  Headache.  Ear pain.  Pain in your forehead, behind your eyes, and over your cheekbones (sinus pain).  Chest pain.  You have long-lasting (chronic) lung disease and any of the following:  Wheezing.  Long-lasting cough.  Coughing up blood.  A change in your usual mucus.  You have a stiff neck.  You have changes in your:  Vision.  Hearing.  Thinking.  Mood. This information is not intended to replace advice given to you by your health care provider. Make sure you discuss any questions you have with your health care provider. Document Released: 02/02/2008 Document Revised: 04/18/2016 Document Reviewed: 11/21/2013 Elsevier Interactive Patient Education  2017 Elsevier Inc.  

## 2016-09-03 NOTE — Progress Notes (Signed)
Haley Erickson 56 y.o.   Chief Complaint  Patient presents with  . Cough    X 1 day with body aches, chills, face hurts    HISTORY OF PRESENT ILLNESS: This is a 56 y.o. female complaining of 4 day h/o cough, facial congestion and pain, with generalized body aches.  Cough  This is a new problem. The current episode started in the past 7 days. The problem has been waxing and waning. The problem occurs hourly. The cough is non-productive. Associated symptoms include chills, ear congestion, headaches (last Tuesday), myalgias, nasal congestion, postnasal drip, rhinorrhea and a sore throat. Pertinent negatives include no chest pain, ear pain, fever, hemoptysis, rash, shortness of breath or wheezing. Risk factors: none. She has tried OTC cough suppressant for the symptoms. The treatment provided no relief.     Prior to Admission medications   Medication Sig Start Date End Date Taking? Authorizing Provider  albuterol (PROVENTIL HFA;VENTOLIN HFA) 108 (90 BASE) MCG/ACT inhaler Inhale 2 puffs into the lungs every 4 (four) hours as needed for wheezing or shortness of breath. 09/25/13  Yes Ryan M Dunn, PA-C  ipratropium (ATROVENT) 0.06 % nasal spray Place 2 sprays into both nostrils 4 (four) times daily. 12/27/14  Yes Hayden Rasmussen, NP  naproxen (NAPROSYN) 500 MG tablet Take 1 tablet (500 mg total) by mouth 2 (two) times daily. 08/26/15  Yes Samantha Tripp Dowless, PA-C  Spacer/Aero-Holding Chambers (E-Z SPACER) inhaler Use as instructed 09/25/13  Yes Ryan M Dunn, PA-C  amoxicillin (AMOXIL) 500 MG capsule Take 1 capsule (500 mg total) by mouth 3 (three) times daily. Patient not taking: Reported on 09/03/2016 04/10/15   Tatyana Kirichenko, PA-C  amoxicillin (AMOXIL) 500 MG capsule Take 1 capsule (500 mg total) by mouth 3 (three) times daily. Patient not taking: Reported on 09/03/2016 01/13/16   Tharon Aquas, PA  fluconazole (DIFLUCAN) 150 MG tablet Take 1 tablet (150 mg total) by mouth once. Repeat in 3 days  as needed. Patient not taking: Reported on 09/03/2016 11/25/14   Charm Rings, MD  HYDROcodone-acetaminophen (NORCO/VICODIN) 5-325 MG per tablet Take 2 tablets by mouth every 4 (four) hours as needed. Patient not taking: Reported on 09/03/2016 02/12/15   Elson Areas, PA-C  HYDROcodone-acetaminophen (NORCO/VICODIN) 5-325 MG tablet Take 2 tablets by mouth every 4 (four) hours as needed. Patient not taking: Reported on 09/03/2016 08/26/15   Samantha Tripp Dowless, PA-C  HYDROcodone-homatropine [facility]) 5-1.5 MG/5ML syrup 1 TSP PO Q 4-6 HOURS PRN COUGH Patient not taking: Reported on 09/03/2016 09/25/13   Raymon Mutton Dunn, PA-C  predniSONE (DELTASONE) 20 MG tablet Take 3 tabs po on first day, 2 tabs second day, 2 tabs third day, 1 tab fourth day, 1 tab 5th day. Take with food. Patient not taking: Reported on 09/03/2016 12/27/14   Hayden Rasmussen, NP    No Known Allergies  There are no active problems to display for this patient.   History reviewed. No pertinent past medical history.  Past Surgical History:  Procedure Laterality Date  . FOOT SURGERY Right 05/13/2016  . TUBAL LIGATION      Social History   Social History  . Marital status: Single    Spouse name: N/A  . Number of children: N/A  . Years of education: N/A   Occupational History  . Not on file.   Social History Main Topics  . Smoking status: Current Some Day Smoker    Packs/day: 0.25    Types: Cigarettes  . Smokeless tobacco:  Never Used  . Alcohol use No     Comment: occcasional  . Drug use: No  . Sexual activity: Not on file   Other Topics Concern  . Not on file   Social History Narrative  . No narrative on file    Family History  Problem Relation Age of Onset  . Diabetes Brother      Review of Systems  Constitutional: Positive for chills. Negative for fever.  HENT: Positive for congestion, postnasal drip, rhinorrhea, sinus pain and sore throat. Negative for ear discharge, ear pain and nosebleeds.   Eyes: Negative.     Respiratory: Positive for cough. Negative for hemoptysis, shortness of breath and wheezing.   Cardiovascular: Negative for chest pain, palpitations and leg swelling.  Gastrointestinal: Negative for abdominal pain, diarrhea, nausea and vomiting.  Genitourinary: Negative.   Musculoskeletal: Positive for myalgias.  Skin: Negative for rash.  Neurological: Positive for weakness and headaches (last Tuesday).  Endo/Heme/Allergies: Negative.   Psychiatric/Behavioral: Negative.   All other systems reviewed and are negative.  Vitals:   09/03/16 1132  BP: 122/68  Pulse: 88  Temp: 99.5 F (37.5 C)     Physical Exam  Constitutional: She is oriented to person, place, and time. She appears well-developed and well-nourished.  HENT:  Head: Normocephalic and atraumatic.  Right Ear: External ear normal.  Left Ear: External ear normal.  Nose: Mucosal edema present.  Mouth/Throat: Posterior oropharyngeal erythema present.  Eyes: Conjunctivae and EOM are normal. Pupils are equal, round, and reactive to light.  Neck: Normal range of motion. Neck supple.  Cardiovascular: Normal rate, regular rhythm, normal heart sounds and intact distal pulses.   Pulmonary/Chest: Effort normal and breath sounds normal.  Abdominal: Soft. Bowel sounds are normal.  Musculoskeletal: Normal range of motion.  Neurological: She is alert and oriented to person, place, and time.  Skin: Skin is warm and dry. Capillary refill takes less than 2 seconds.  Psychiatric: She has a normal mood and affect. Her behavior is normal.  Vitals reviewed.    ASSESSMENT & PLAN: Haley BallRobin was seen today for cough.  Diagnoses and all orders for this visit:  Acute upper respiratory infection  Cough  Generalized muscle ache  Other orders -     benzonatate (TESSALON) 200 MG capsule; Take 1 capsule (200 mg total) by mouth 2 (two) times daily as needed for cough. -     HYDROcodone-acetaminophen (NORCO) 5-325 MG tablet; Take 1 tablet by  mouth every 6 (six) hours as needed for moderate pain. -     promethazine-dextromethorphan (PROMETHAZINE-DM) 6.25-15 MG/5ML syrup; Take 5 mLs by mouth 3 (three) times daily as needed for cough.   Probably has influenza that started 4 days ago; outside the window of opportunity to use Tamiflu; will treat symptomatically.  Patient Instructions       IF you received an x-ray today, you will receive an invoice from [facility]. Please contact [facility] at [phone] with questions or concerns regarding your invoice.   IF you received labwork today, you will receive an invoice from [facility]. Please contact [facility] at [phone] with questions or concerns regarding your invoice.   Our billing staff will not be able to assist you with questions regarding bills from these companies.  You will be contacted with the lab results as soon as they are available. The fastest way to get your results is to activate your My Chart account. Instructions are located on the last page of this paperwork. If you have not  heard from [place] regarding the results in 2 weeks, please contact this office.     Upper Respiratory Infection, Adult Most upper respiratory infections (URIs) are caused by a virus. A URI affects the nose, throat, and upper air passages. The most common type of URI is often called "the common cold." Follow these instructions at home:  Take medicines only as told by your doctor.  Gargle warm saltwater or take cough drops to comfort your throat as told by your doctor.  Use a warm mist humidifier or inhale steam from a shower to increase air moisture. This may make it easier to breathe.  Drink enough fluid to keep your pee (urine) clear or pale yellow.  Eat soups and other clear broths.  Have a healthy diet.  Rest as needed.  Go back to work when your fever is gone or your doctor says it is okay.  You may need to stay home longer to avoid giving your URI to  others.  You can also wear a face mask and wash your hands often to prevent spread of the virus.  Use your inhaler more if you have asthma.  Do not use any tobacco products, including cigarettes, chewing tobacco, or electronic cigarettes. If you need help quitting, ask your doctor. Contact a doctor if:  You are getting worse, not better.  Your symptoms are not helped by medicine.  You have chills.  You are getting more short of breath.  You have brown or red mucus.  You have yellow or brown discharge from your nose.  You have pain in your face, especially when you bend forward.  You have a fever.  You have puffy (swollen) neck glands.  You have pain while swallowing.  You have white areas in the back of your throat. Get help right away if:  You have very bad or constant:  Headache.  Ear pain.  Pain in your forehead, behind your eyes, and over your cheekbones (sinus pain).  Chest pain.  You have long-lasting (chronic) lung disease and any of the following:  Wheezing.  Long-lasting cough.  Coughing up blood.  A change in your usual mucus.  You have a stiff neck.  You have changes in your:  Vision.  Hearing.  Thinking.  Mood. This information is not intended to replace advice given to you by your health care provider. Make sure you discuss any questions you have with your health care provider. Document Released: 02/02/2008 Document Revised: 04/18/2016 Document Reviewed: 11/21/2013 Elsevier Interactive Patient Education  2017 Elsevier Inc.      Edwina Barth, MD [facility]

## 2017-01-14 IMAGING — CR DG KNEE COMPLETE 4+V*R*
4 series · 4 of 4 positions shown · non-contrast
Comparison: April 22, 2008.

CLINICAL DATA: Acute right knee pain without known injury.

EXAM:
RIGHT KNEE - COMPLETE 4+ VIEW

[knee ap]
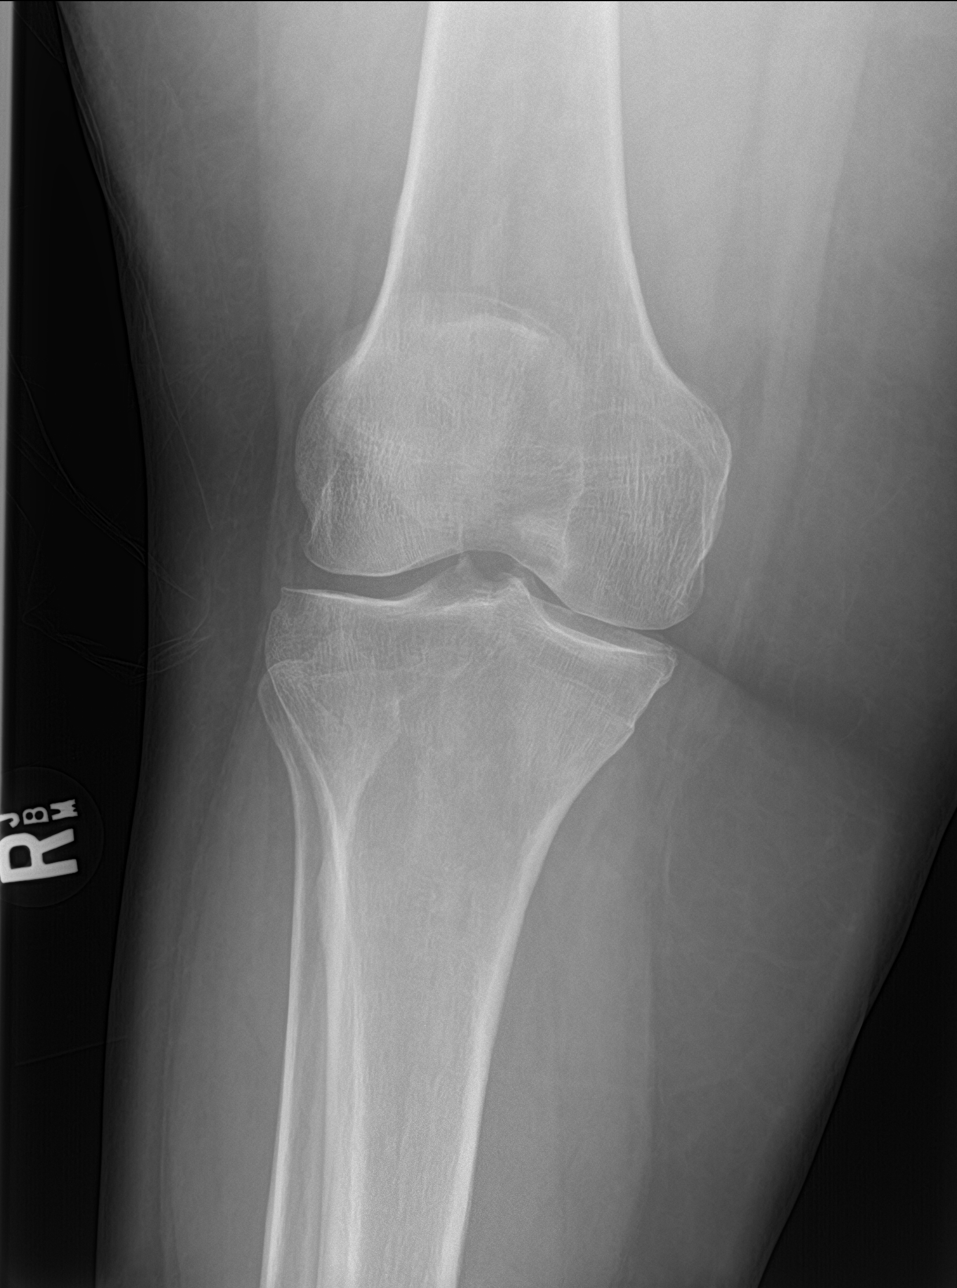

[knee lat]
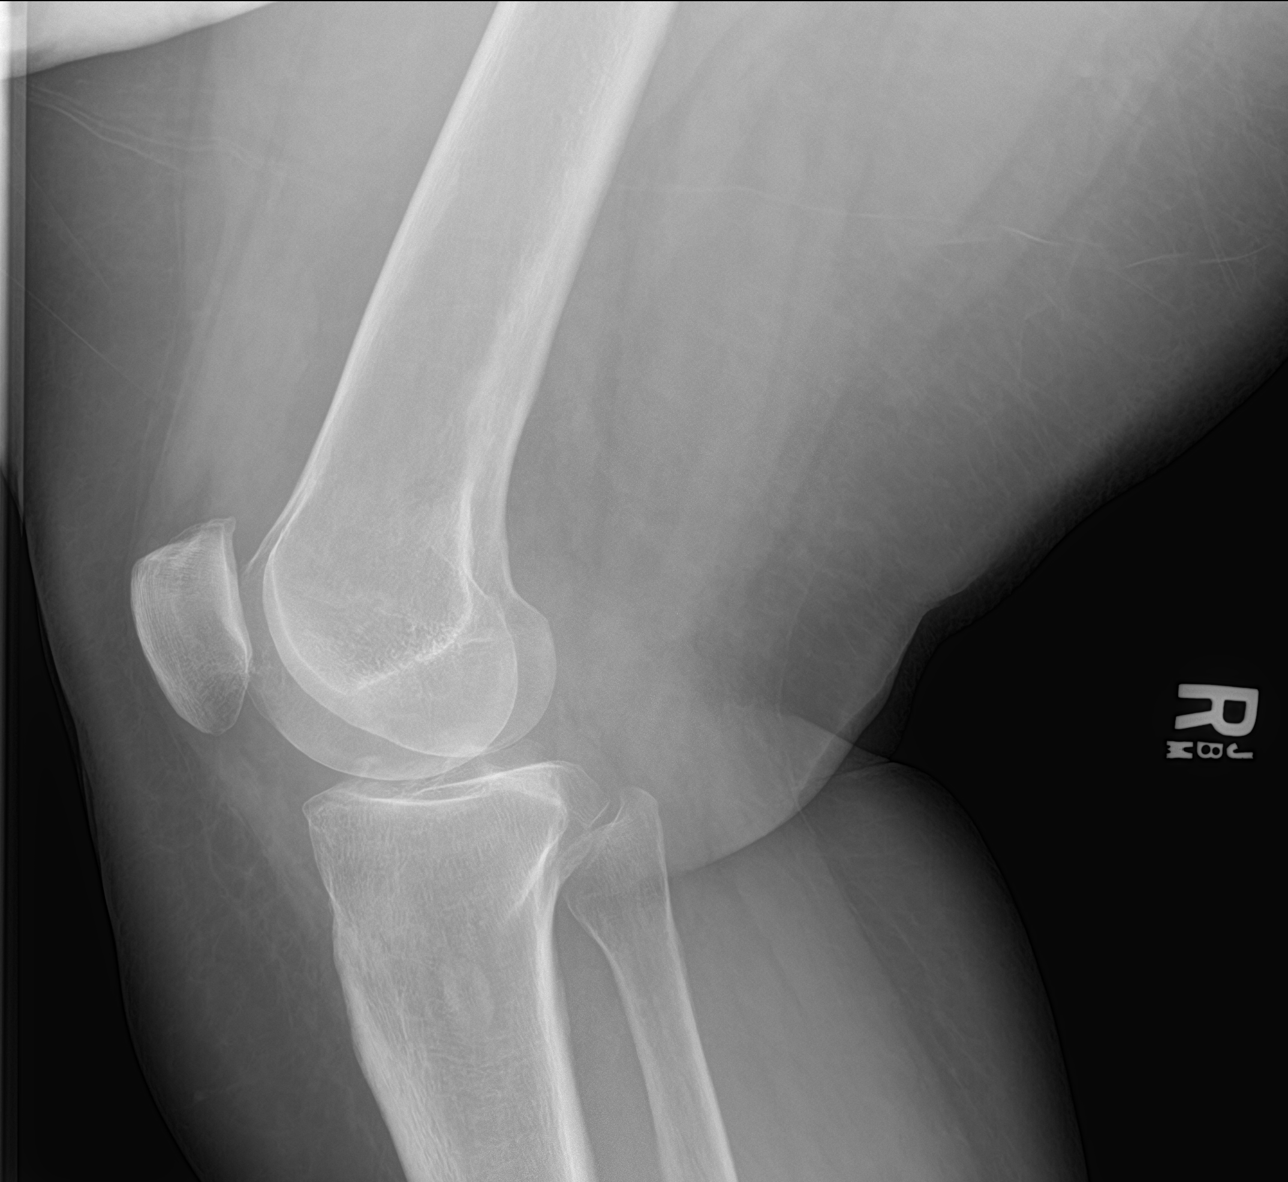

[knee obl (1 of 2)]
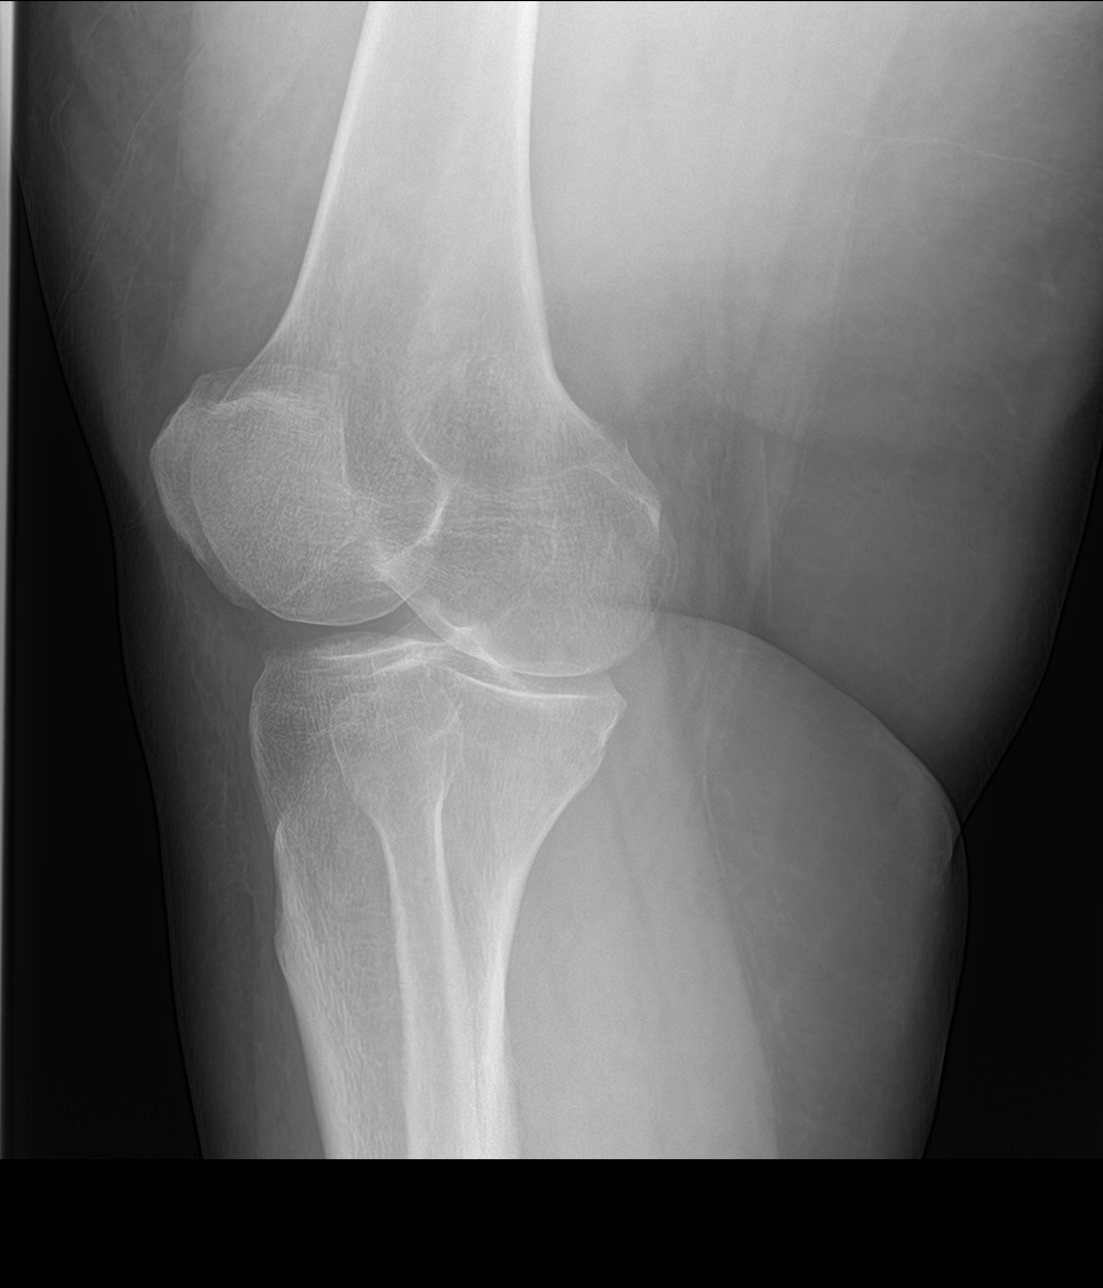

[knee obl (2 of 2)]
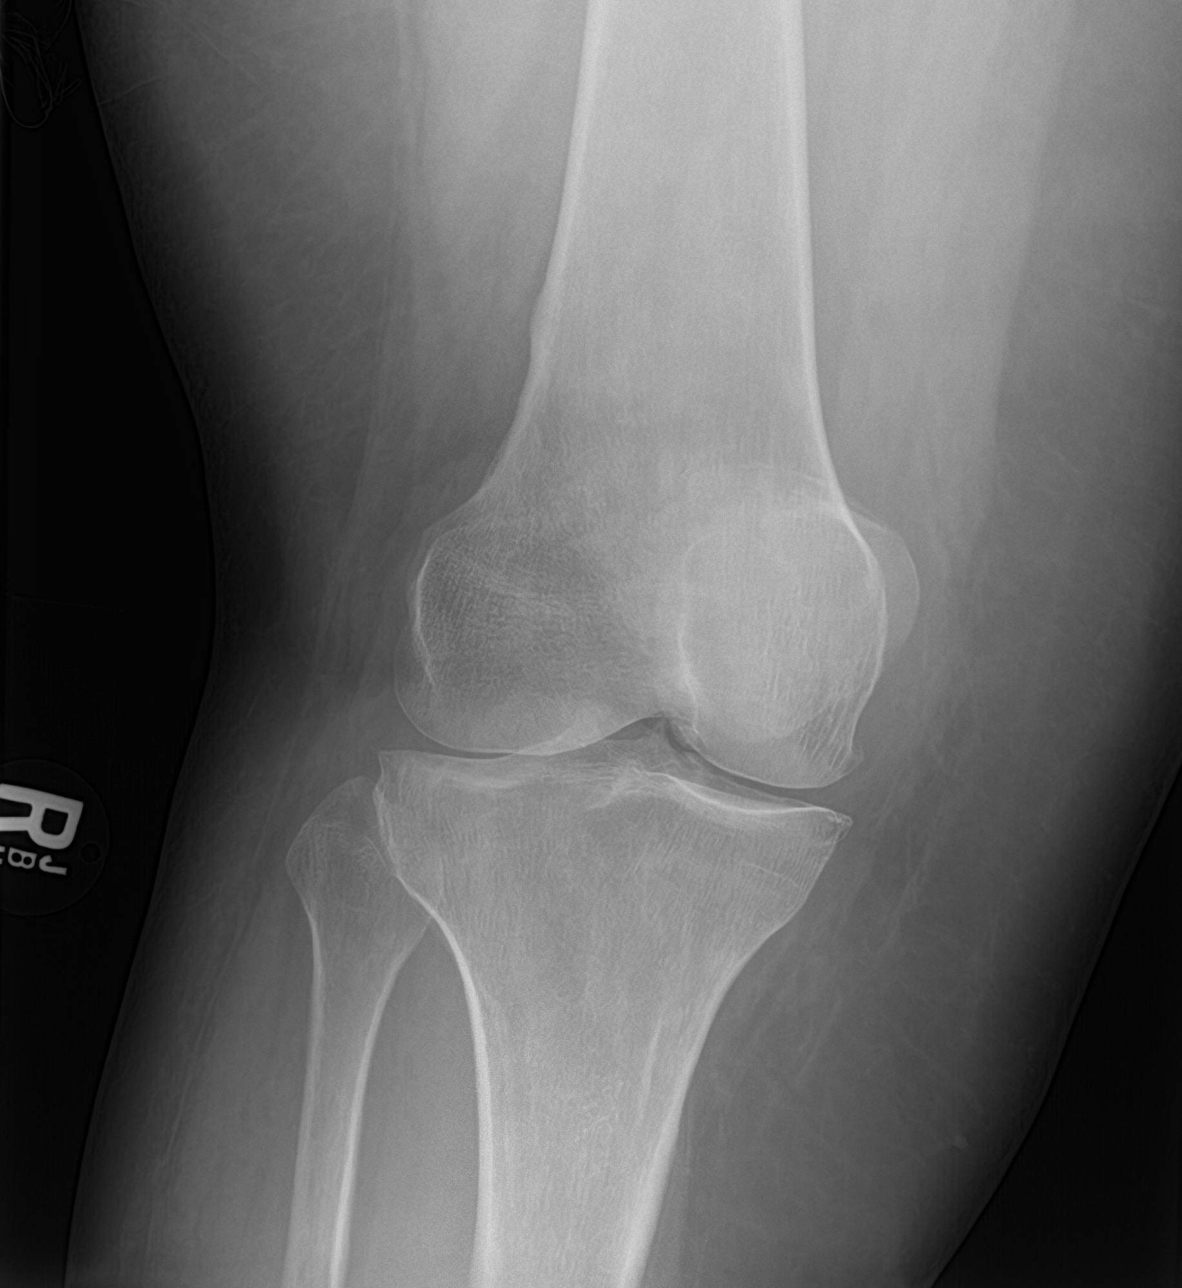

[4 of 4 positions shown; findings below may reference images not displayed]

FINDINGS: There is no evidence of fracture, dislocation, or joint effusion.
Minimal narrowing of medial joint space is noted with osteophyte
formation. Soft tissues are unremarkable.
IMPRESSION: Minimal degenerative joint disease is noted medially. No acute
abnormality seen in the right knee.

## 2017-03-07 ENCOUNTER — Emergency Department (HOSPITAL_COMMUNITY)
Admission: EM | Admit: 2017-03-07 | Discharge: 2017-03-07 | Disposition: A | Discharge status: Home / Self Care | Payer: PRIVATE HEALTH INSURANCE | Attending: Emergency Medicine | Admitting: Emergency Medicine

## 2017-03-07 ENCOUNTER — Emergency Department (HOSPITAL_COMMUNITY): Payer: PRIVATE HEALTH INSURANCE

## 2017-03-07 ENCOUNTER — Encounter (HOSPITAL_COMMUNITY): Payer: Self-pay | Admitting: Emergency Medicine

## 2017-03-07 DIAGNOSIS — B9789 Other viral agents as the cause of diseases classified elsewhere: Secondary | ICD-10-CM | POA: Insufficient documentation

## 2017-03-07 DIAGNOSIS — Z791 Long term (current) use of non-steroidal anti-inflammatories (NSAID): Secondary | ICD-10-CM | POA: Diagnosis not present

## 2017-03-07 DIAGNOSIS — Z79899 Other long term (current) drug therapy: Secondary | ICD-10-CM | POA: Insufficient documentation

## 2017-03-07 DIAGNOSIS — J069 Acute upper respiratory infection, unspecified: Secondary | ICD-10-CM | POA: Diagnosis not present

## 2017-03-07 DIAGNOSIS — R05 Cough: Secondary | ICD-10-CM | POA: Diagnosis present

## 2017-03-07 DIAGNOSIS — F17210 Nicotine dependence, cigarettes, uncomplicated: Secondary | ICD-10-CM | POA: Insufficient documentation

## 2017-03-07 LAB — CBC
HCT: 44.8 % (ref 36.0–46.0)
Hemoglobin: 15.3 g/dL — ABNORMAL HIGH (ref 12.0–15.0)
MCH: 30 pg (ref 26.0–34.0)
MCHC: 34.2 g/dL (ref 30.0–36.0)
MCV: 87.8 fL (ref 78.0–100.0)
Platelets: 283 10*3/uL (ref 150–400)
RBC: 5.1 MIL/uL (ref 3.87–5.11)
RDW: 13.4 % (ref 11.5–15.5)
WBC: 8.5 10*3/uL (ref 4.0–10.5)

## 2017-03-07 LAB — BASIC METABOLIC PANEL
Anion gap: 6 (ref 5–15)
BUN: 15 mg/dL (ref 6–20)
CALCIUM: 9.2 mg/dL (ref 8.9–10.3)
CO2: 27 mmol/L (ref 22–32)
Chloride: 104 mmol/L (ref 101–111)
Creatinine, Ser: 0.69 mg/dL (ref 0.44–1.00)
GFR calc Af Amer: >60 mL/min (ref >60)
GLUCOSE: 123 mg/dL — AB (ref 65–99)
POTASSIUM: 3.8 mmol/L (ref 3.5–5.1)
SODIUM: 137 mmol/L (ref 135–145)

## 2017-03-07 LAB — RAPID STREP SCREEN (MED CTR MEBANE ONLY): Streptococcus, Group A Screen (Direct): NEGATIVE

## 2017-03-07 MED ORDER — PREDNISONE 10 MG PO TABS: 40.0000 mg | ORAL_TABLET | Freq: Every day | ORAL | 0 refills | Status: AC

## 2017-03-07 NOTE — Discharge Instructions (Signed)
Continue to use the decongestants and nasal spray. Use Tylenol or ibuprofen as needed for fever. Stay well-hydrated. [facility] in one week if symptoms are not improving.  Return to the emergency room if you develop any new or worsening symptoms.

## 2017-03-07 NOTE — ED Provider Notes (Signed)
[facility] Provider Note   CSN: [id number] Arrival date & time: 03/07/17  0527     History   Chief Complaint Chief Complaint  Patient presents with  . Sore Throat  . Cough  . Facial Pain  . Nasal Congestion    HPI Haley Erickson is a 56 y.o. female presenting with 2 weeks nasal congestion and cough.  Patient states that she has been having cold-like symptoms for the past 2 weeks including cough, nasal congestion, and postnasal drip. This morning, she woke up she had a sore throat and some laryngitis. She denies fever or chills. She denies nausea, vomiting, abnormal bowel habits, or urinary symptoms. She states she has been using over-the-counter decongestants including Mucinex, allergy medicine, and Flonase. She felt like she is getting better until she woke up this morning with her throat symptoms. Patient reports she has no past medical history, does not take medicines on daily basis.  HPI  History reviewed. No pertinent past medical history.  Patient Active Problem List   Diagnosis Date Noted  . Cough 09/03/2016  . Acute upper respiratory infection 09/03/2016    Past Surgical History:  Procedure Laterality Date  . FOOT SURGERY Right 05/13/2016  . TUBAL LIGATION      OB History    No data available       Home Medications    Prior to Admission medications   Medication Sig Start Date End Date Taking? Authorizing Provider  albuterol (PROVENTIL HFA;VENTOLIN HFA) 108 (90 BASE) MCG/ACT inhaler Inhale 2 puffs into the lungs every 4 (four) hours as needed for wheezing or shortness of breath. 09/25/13   Dunn, Raymon Muttonyan M, PA-C  amoxicillin (AMOXIL) 500 MG capsule Take 1 capsule (500 mg total) by mouth 3 (three) times daily. Patient not taking: Reported on 09/03/2016 04/10/15   Jaynie CrumbleKirichenko, Tatyana, PA-C  amoxicillin (AMOXIL) 500 MG capsule Take 1 capsule (500 mg total) by mouth 3 (three) times daily. Patient not taking: Reported on 09/03/2016 01/13/16   Tharon AquasPatrick, Frank C,  PA  benzonatate (TESSALON) 200 MG capsule Take 1 capsule (200 mg total) by mouth 2 (two) times daily as needed for cough. 09/03/16   Georgina QuintSagardia, Miguel Jose, MD  fluconazole (DIFLUCAN) 150 MG tablet Take 1 tablet (150 mg total) by mouth once. Repeat in 3 days as needed. Patient not taking: Reported on 09/03/2016 11/25/14   Charm RingsHonig, Erin J, MD  HYDROcodone-homatropine [facility](HYCODAN) 5-1.5 MG/5ML syrup 1 TSP PO Q 4-6 HOURS PRN COUGH Patient not taking: Reported on 09/03/2016 09/25/13   Sondra Bargesunn, Ryan M, PA-C  ipratropium (ATROVENT) 0.06 % nasal spray Place 2 sprays into both nostrils 4 (four) times daily. 12/27/14   Hayden RasmussenMabe, David, NP  naproxen (NAPROSYN) 500 MG tablet Take 1 tablet (500 mg total) by mouth 2 (two) times daily. 08/26/15   Dowless, Lelon MastSamantha Tripp, PA-C  predniSONE (DELTASONE) 10 MG tablet Take 4 tablets (40 mg total) by mouth daily. 03/07/17 03/11/17  Izora Benn, PA-C  promethazine-dextromethorphan (PROMETHAZINE-DM) 6.25-15 MG/5ML syrup Take 5 mLs by mouth 3 (three) times daily as needed for cough. 09/03/16   Georgina QuintSagardia, Miguel Jose, MD  Spacer/Aero-Holding Chambers (E-Z SPACER) inhaler Use as instructed 09/25/13   Sondra Bargesunn, Ryan M, PA-C    Family History Family History  Problem Relation Age of Onset  . Diabetes Brother     Social History Social History  Substance Use Topics  . Smoking status: Current Some Day Smoker    Packs/day: 0.25    Types: Cigarettes  . Smokeless tobacco:  Never Used  . Alcohol use No     Comment: occcasional     Allergies   Patient has no known allergies.   Review of Systems Review of Systems  Constitutional: Negative for chills and fever.  HENT: Positive for congestion, sinus pressure and sore throat. Negative for trouble swallowing.   Eyes: Negative for pain and itching.  Respiratory: Positive for cough. Negative for chest tightness and shortness of breath.   Cardiovascular: Negative for chest pain.  Gastrointestinal: Negative for abdominal distention, abdominal  pain, constipation, diarrhea, nausea and vomiting.  Genitourinary: Negative for dysuria, frequency and hematuria.  Musculoskeletal: Negative for arthralgias, myalgias, neck pain and neck stiffness.  Skin: Negative for rash.  Neurological: Negative for dizziness and headaches.  Psychiatric/Behavioral: Negative for agitation and confusion.     Physical Exam Updated Vital Signs BP (!) 188/98 (BP Location: Left Arm)   Pulse 83   Temp 98 F (36.7 C) (Oral)   Resp 18   Ht 5\' 7"  (1.702 m)   Wt 117.9 kg (260 lb)   LMP 05/22/2013   SpO2 100%   BMI 40.72 kg/m   Physical Exam  Constitutional: She is oriented to person, place, and time. She appears well-developed and well-nourished. No distress.  HENT:  Head: Normocephalic and atraumatic.  Right Ear: Tympanic membrane, external ear and ear canal normal.  Left Ear: Tympanic membrane, external ear and ear canal normal.  Nose: Mucosal edema and rhinorrhea present. Right sinus exhibits no maxillary sinus tenderness and no frontal sinus tenderness. Left sinus exhibits no maxillary sinus tenderness and no frontal sinus tenderness.  Mouth/Throat: Uvula is midline and mucous membranes are normal. Posterior oropharyngeal erythema (cobblestone appearance) present. No oropharyngeal exudate or posterior oropharyngeal edema.  Eyes: Conjunctivae and EOM are normal. Pupils are equal, round, and reactive to light.  Neck: Normal range of motion. Neck supple.  Cardiovascular: Normal rate and regular rhythm.   Pulmonary/Chest: Effort normal and breath sounds normal. No respiratory distress. She has no wheezes.  Abdominal: Soft. Bowel sounds are normal. She exhibits no distension. There is no tenderness.  Musculoskeletal: Normal range of motion.  Lymphadenopathy:    She has no cervical adenopathy.  Neurological: She is alert and oriented to person, place, and time.  Skin: Skin is warm and dry.  Psychiatric: She has a normal mood and affect.  Nursing note  and vitals reviewed.    ED Treatments / Results  Labs (all labs ordered are listed, but only abnormal results are displayed) Labs Reviewed  BASIC METABOLIC PANEL - Abnormal; Notable for the following:       Result Value   Glucose, Bld 123 (*)    All other components within normal limits  CBC - Abnormal; Notable for the following:    Hemoglobin 15.3 (*)    All other components within normal limits  RAPID STREP SCREEN (NOT AT [facility])  CULTURE, GROUP A STREP [facility])    EKG  EKG Interpretation None       Radiology Dg Chest 2 View  Result Date: 03/07/2017 CLINICAL DATA:  56 year old female with high blood pressure and cold symptoms. EXAM: CHEST  2 VIEW COMPARISON:  Chest radiograph dated 12/04/2009 FINDINGS: The heart size and mediastinal contours are within normal limits. Both lungs are clear. The visualized skeletal structures are unremarkable. IMPRESSION: No active cardiopulmonary disease. Electronically Signed   By: Elgie Collard M.D.   On: 03/07/2017 06:48    Procedures Procedures (including critical care time)  Medications Ordered in ED  Medications - No data to display   Initial Impression / Assessment and Plan / ED Course  I have reviewed the triage vital signs and the nursing notes.  Pertinent labs & imaging results that were available during my care of the patient were reviewed by me and considered in my medical decision making (see chart for details).     Patient presenting with 2 weeks history congestion and cough. Patient states she thought her cough was improving. However, this one she woke up with an increased sore throat, and raspy voice. Will order strep test and CXR. Patient adamantly states she is not having chest pain, and she does not know where this complaint came from. At this time, I do not see a need for EKG or troponin. Chest x-ray and strep negative. Patient likely with viral illness. Labs reassuring. Will discharge patient with prednisone rx and  instructions to continue to use OTC symptom relief medications. Discussed viral illnesses are treated symptomatically, and need to clear on their own.  Patient appears safe for discharge. Return precautions given. Patient states she understands and agrees to plan.  Final Clinical Impressions(s) / ED Diagnoses   Final diagnoses:  Viral URI with cough    New Prescriptions Discharge Medication List as of 03/07/2017  9:08 AM       Alveria Apley, PA-C 03/07/17 1557    Lorre Nick, MD 03/11/17 2256

## 2017-03-07 NOTE — ED Notes (Signed)
Patient is alert and oriented x3.  She was given DC instructions and follow up visit instructions.  Patient gave verbal understanding. She was DC ambulatory under her own power to home.  V/S stable.  He was not showing any signs of distress on DC 

## 2017-03-07 NOTE — ED Triage Notes (Signed)
Patient complaining of sore throat, cough, cold, facial pain, and loosing voice. Patient has had this for about a week. Patient bp is high.

## 2017-03-09 LAB — CULTURE, GROUP A STREP (THRC)

## 2017-06-09 ENCOUNTER — Ambulatory Visit (INDEPENDENT_AMBULATORY_CARE_PROVIDER_SITE_OTHER): Payer: PRIVATE HEALTH INSURANCE

## 2017-06-09 ENCOUNTER — Encounter (INDEPENDENT_AMBULATORY_CARE_PROVIDER_SITE_OTHER): Payer: Self-pay | Admitting: Orthopedic Surgery

## 2017-06-09 ENCOUNTER — Ambulatory Visit (INDEPENDENT_AMBULATORY_CARE_PROVIDER_SITE_OTHER): Payer: PRIVATE HEALTH INSURANCE | Admitting: Orthopedic Surgery

## 2017-06-09 DIAGNOSIS — M25562 Pain in left knee: Secondary | ICD-10-CM | POA: Diagnosis not present

## 2017-06-09 DIAGNOSIS — G8929 Other chronic pain: Secondary | ICD-10-CM

## 2017-06-09 DIAGNOSIS — M1712 Unilateral primary osteoarthritis, left knee: Secondary | ICD-10-CM

## 2017-06-09 DIAGNOSIS — M25561 Pain in right knee: Secondary | ICD-10-CM

## 2017-06-09 DIAGNOSIS — M1711 Unilateral primary osteoarthritis, right knee: Secondary | ICD-10-CM | POA: Diagnosis not present

## 2017-06-09 MED ORDER — IBUPROFEN-FAMOTIDINE 800-26.6 MG PO TABS: 1.0000 | ORAL_TABLET | Freq: Two times a day (BID) | ORAL | 3 refills | Status: DC | PRN

## 2017-06-09 MED ORDER — TRAMADOL HCL 50 MG PO TABS: 50.0000 mg | ORAL_TABLET | Freq: Two times a day (BID) | ORAL | 0 refills | Status: DC | PRN

## 2017-06-10 NOTE — Progress Notes (Signed)
Office Visit Note   Patient: Haley Erickson           Date of Birth: 1961-08-01           MRN: [id number] Visit Date: 06/09/2017 Requested by: No referring provider defined for this encounter. PCP: Patient, No Pcp Per  Subjective: Chief Complaint  Patient presents with  . Knee Pain    bilateral right worse than left    HPI: Haley Erickson is a 56 year old patient bilateral knee pain right worse and left.  Reports pain for the last 2-3 weeks.  She fell 2 years ago. Previous treatment was with Dr. Berline Chough for knee pain.  She describes swelling weakness giving way popping and locking as well as pain which wakes her from sleep at night.  She has tried Tylenol and ibuprofen.  Cortisone has not been helpful.  Pain was worse after foot surgery.  Describes pain primarily in the right knee on the medial side.  She wants to avoid surgery for as long as possible              ROS: All systems reviewed are negative as they relate to the chief complaint within the history of present illness.  Patient denies  fevers or chills.   Assessment & Plan: Visit Diagnoses:  1. Chronic pain of both knees   2. Unilateral primary osteoarthritis, left knee   3. Unilateral primary osteoarthritis, right knee     Plan: impression is bilateral knee pain with fairly significant medial compartment arthritis bilaterally.  The right does look worse than left.  We talked about dietary adjustments to unload the knee.  We'll try her with 2 axis  Also use Ultram for severe pain.  Pre-approve Synvisc for injection in both knees as well.  I'll see her back for that injection.  Weight loss will be the most critical factor for delaying eventual knee replacement  Follow-Up Instructions: No Follow-up on file.   Orders:  Orders Placed This Encounter  Procedures  . XR KNEE 3 VIEW LEFT  . XR KNEE 3 VIEW RIGHT   Meds ordered this encounter  Medications  . traMADol (ULTRAM) 50 MG tablet    Sig: Take 1 tablet (50 mg total) by mouth  every 12 (twelve) hours as needed.    Dispense:  60 tablet    Refill:  0  . Ibuprofen-Famotidine (DUEXIS) 800-26.6 MG TABS    Sig: Take 1 tablet by mouth 2 (two) times daily as needed.    Dispense:  90 tablet    Refill:  3      Procedures: No procedures performed   Clinical Data: No additional findings.  Objective: Vital Signs: LMP 05/22/2013   Physical Exam:   Constitutional: Patient appears well-developed HEENT:  Head: Normocephalic Eyes:EOM are normal Neck: Normal range of motion Cardiovascular: Normal rate Pulmonary/chest: Effort normal Neurologic: Patient is alert Skin: Skin is warm Psychiatric: Patient has normal mood and affect    Ortho Exam: orthopedic exam demonstrates normal gait alignment palpable pedal pulses good ankle dorsi flexion plantar flexion quite hamstring strength.  No effusion in her knee.  Collateral and cruciate ligaments are stable.  Extensor mechanism is intact.  No other mash adenopathy or skin changes noted in the right or left knee region.  There is no groin pain with internal/external rotation of the leg.  Specialty Comments:  No specialty comments available.  Imaging: Xr Knee 3 View Left  Result Date: 06/10/2017 AP lateral merchant left knee reviewed.  Joint  space recently well maintained with minimal spurring.  Patellofemoral wear on the lateral facet is present.  No other soft tissue calcifications around the femur or tibia  Xr Knee 3 View Right  Result Date: 06/10/2017 AP lateral merchant view right knee reviewed.  Mild spurring in the medial compartment is noted particularly on the femoral side.  Overall alignment of the bones intact. There is also patellofemoral arthritis present.  No posterior spurring noted    PMFS History: Patient Active Problem List   Diagnosis Date Noted  . Cough 09/03/2016  . Acute upper respiratory infection 09/03/2016   No past medical history on file.  Family History  Problem Relation Age of  Onset  . Diabetes Brother     Past Surgical History:  Procedure Laterality Date  . FOOT SURGERY Right 05/13/2016  . TUBAL LIGATION     Social History   Occupational History  . Not on file.   Social History Main Topics  . Smoking status: Current Some Day Smoker    Packs/day: 0.25    Types: Cigarettes  . Smokeless tobacco: Never Used  . Alcohol use No     Comment: occcasional  . Drug use: No  . Sexual activity: Not on file

## 2017-06-15 ENCOUNTER — Telehealth (INDEPENDENT_AMBULATORY_CARE_PROVIDER_SITE_OTHER): Payer: Self-pay

## 2017-06-15 NOTE — Telephone Encounter (Signed)
I tried submitting BV online for patient to get gel injection. Received notification from them stating that incorrect information provided was invalid. I have called patient and LMVM for her to call me back to verify insurance information. Last card that is on file for patient is from January.

## 2017-09-13 ENCOUNTER — Telehealth (HOSPITAL_COMMUNITY): Payer: Self-pay | Admitting: Physician Assistant

## 2017-09-13 ENCOUNTER — Ambulatory Visit (HOSPITAL_COMMUNITY)
Admission: EM | Admit: 2017-09-13 | Discharge: 2017-09-13 | Disposition: A | Discharge status: Home / Self Care | Payer: PRIVATE HEALTH INSURANCE | Attending: Family Medicine | Admitting: Family Medicine

## 2017-09-13 ENCOUNTER — Encounter (HOSPITAL_COMMUNITY): Payer: Self-pay | Admitting: Family Medicine

## 2017-09-13 DIAGNOSIS — R05 Cough: Secondary | ICD-10-CM

## 2017-09-13 DIAGNOSIS — R059 Cough, unspecified: Secondary | ICD-10-CM

## 2017-09-13 DIAGNOSIS — J00 Acute nasopharyngitis [common cold]: Secondary | ICD-10-CM

## 2017-09-13 MED ORDER — CETIRIZINE HCL 10 MG PO TABS: 10.0000 mg | ORAL_TABLET | Freq: Every day | ORAL | 0 refills | Status: DC

## 2017-09-13 MED ORDER — FLUTICASONE PROPIONATE 50 MCG/ACT NA SUSP: 2.0000 | Freq: Every day | NASAL | 0 refills | Status: DC

## 2017-09-13 MED ORDER — BENZONATATE 100 MG PO CAPS: 100.0000 mg | ORAL_CAPSULE | Freq: Three times a day (TID) | ORAL | 0 refills | Status: DC

## 2017-09-13 MED ORDER — MELOXICAM 7.5 MG PO TABS: 7.5000 mg | ORAL_TABLET | Freq: Every day | ORAL | 0 refills | Status: DC

## 2017-09-13 MED ORDER — ALBUTEROL SULFATE HFA 108 (90 BASE) MCG/ACT IN AERS: 2.0000 | INHALATION_SPRAY | RESPIRATORY_TRACT | 1 refills | Status: DC | PRN

## 2017-09-13 NOTE — ED Provider Notes (Signed)
[facility]    CSN: [id number] Arrival date & time: 09/13/17  1017     History   Chief Complaint Chief Complaint  Patient presents with  . Cough  . Nasal Congestion    HPI Haley Erickson is a 57 y.o. female.   57 year old female comes in for 3-4-day history of URI symptoms. Productive cough, nasal congestion, rhinorrhea, head pressure, throat irritation. Fever with Tmax of 102, motrin, last took last night. States mucinex with good relief. Shortness of breath with mild wheezing. Currently quitting smoking, last smoked 2 weeks ago. Smoked for 5-10 years, 1/4ppd.       History reviewed. No pertinent past medical history.  Patient Active Problem List   Diagnosis Date Noted  . Cough 09/03/2016  . Acute upper respiratory infection 09/03/2016    Past Surgical History:  Procedure Laterality Date  . FOOT SURGERY Right 05/13/2016  . TUBAL LIGATION      OB History    No data available       Home Medications    Prior to Admission medications   Medication Sig Start Date End Date Taking? Authorizing Provider  albuterol (PROVENTIL HFA;VENTOLIN HFA) 108 (90 Base) MCG/ACT inhaler Inhale 2 puffs into the lungs every 4 (four) hours as needed for wheezing or shortness of breath. 09/13/17   Cathie HoopsYu, Maya Arcand V, PA-C  benzonatate (TESSALON) 100 MG capsule Take 1 capsule (100 mg total) by mouth every 8 (eight) hours. 09/13/17   Cathie HoopsYu, Janelys Glassner V, PA-C  cetirizine (ZYRTEC) 10 MG tablet Take 1 tablet (10 mg total) by mouth daily. 09/13/17   Cathie HoopsYu, Shahrzad Koble V, PA-C  fluticasone (FLONASE) 50 MCG/ACT nasal spray Place 2 sprays into both nostrils daily. 09/13/17   Cathie HoopsYu, Bridgitte Felicetti V, PA-C  Ibuprofen-Famotidine (DUEXIS) 800-26.6 MG TABS Take 1 tablet by mouth 2 (two) times daily as needed. 06/09/17   Cammy Copaean, Gregory Scott, MD  meloxicam (MOBIC) 7.5 MG tablet Take 1 tablet (7.5 mg total) by mouth daily. 09/13/17   Belinda FisherYu, Nadezhda Pollitt V, PA-C  Spacer/Aero-Holding Chambers (E-Z SPACER) inhaler Use as instructed 09/25/13   Sondra Bargesunn, Ryan  M, PA-C    Family History Family History  Problem Relation Age of Onset  . Diabetes Brother     Social History Social History   Tobacco Use  . Smoking status: Current Some Day Smoker    Packs/day: 0.25    Types: Cigarettes  . Smokeless tobacco: Never Used  Substance Use Topics  . Alcohol use: No    Comment: occcasional  . Drug use: No     Allergies   Patient has no known allergies.   Review of Systems Review of Systems  Reason unable to perform ROS: See HPI as above.     Physical Exam Triage Vital Signs ED Triage Vitals [09/13/17 1126]  Enc Vitals Group     BP (!) 155/94     Pulse Rate 71     Resp 18     Temp 97.9 F (36.6 C)     Temp src      SpO2 97 %     Weight      Height      Head Circumference      Peak Flow      Pain Score      Pain Loc      Pain Edu?      Excl. in GC?    No data found.  Updated Vital Signs BP (!) 155/94   Pulse 71   Temp  97.9 F (36.6 C)   Resp 18   LMP 05/22/2013   SpO2 97%   Physical Exam  Constitutional: She is oriented to person, place, and time. She appears well-developed and well-nourished. No distress.  HENT:  Head: Normocephalic and atraumatic.  Right Ear: External ear and ear canal normal. Tympanic membrane is erythematous. Tympanic membrane is not bulging.  Left Ear: External ear and ear canal normal. Tympanic membrane is erythematous. Tympanic membrane is not bulging.  Nose: Mucosal edema and rhinorrhea present. Right sinus exhibits maxillary sinus tenderness and frontal sinus tenderness. Left sinus exhibits maxillary sinus tenderness and frontal sinus tenderness.  Mouth/Throat: Uvula is midline, oropharynx is clear and moist and mucous membranes are normal.  Eyes: Conjunctivae are normal. Pupils are equal, round, and reactive to light.  Neck: Normal range of motion. Neck supple.  Cardiovascular: Normal rate, regular rhythm and normal heart sounds. Exam reveals no gallop and no friction rub.  No murmur  heard. Pulmonary/Chest: Effort normal and breath sounds normal. She has no decreased breath sounds. She has no wheezes. She has no rhonchi. She has no rales.  Lymphadenopathy:    She has no cervical adenopathy.  Neurological: She is alert and oriented to person, place, and time.  Skin: Skin is warm and dry.  Psychiatric: She has a normal mood and affect. Her behavior is normal. Judgment normal.     UC Treatments / Results  Labs (all labs ordered are listed, but only abnormal results are displayed) Labs Reviewed - No data to display  EKG  EKG Interpretation None       Radiology No results found.  Procedures Procedures (including critical care time)  Medications Ordered in UC Medications - No data to display   Initial Impression / Assessment and Plan / UC Course  I have reviewed the triage vital signs and the nursing notes.  Pertinent labs & imaging results that were available during my care of the patient were reviewed by me and considered in my medical decision making (see chart for details).    Discussed with patient history and exam most consistent with viral URI. Symptomatic treatment as needed. Push fluids. Return precautions given.   Final Clinical Impressions(s) / UC Diagnoses   Final diagnoses:  Acute nasopharyngitis    ED Discharge Orders        Ordered    albuterol (PROVENTIL HFA;VENTOLIN HFA) 108 (90 Base) MCG/ACT inhaler  Every 4 hours PRN    Comments:  Please fill with generic albuterol   09/13/17 1152    benzonatate (TESSALON) 100 MG capsule  Every 8 hours     09/13/17 1152    fluticasone (FLONASE) 50 MCG/ACT nasal spray  Daily     09/13/17 1152    cetirizine (ZYRTEC) 10 MG tablet  Daily     09/13/17 1152    meloxicam (MOBIC) 7.5 MG tablet  Daily     09/13/17 1153       Belinda Fisher, PA-C 09/13/17 1157

## 2017-09-13 NOTE — Discharge Instructions (Signed)
Tessalon for cough. Start flonase, zyrtec for nasal congestion. Continue mucinex-D for nasal congestion and chest congestion. You can use over the counter nasal saline rinse such as neti pot for nasal congestion. Keep hydrated, your urine should be clear to pale yellow in color. Tylenol/motrin for fever and pain. Monitor for any worsening of symptoms, chest pain, shortness of breath, wheezing, swelling of the throat, follow up for reevaluation.   For sore throat try using a honey-based tea. Use 3 teaspoons of honey with juice squeezed from half lemon. Place shaved pieces of ginger into 1/2-1 cup of water and warm over stove top. Then mix the ingredients and repeat every 4 hours as needed.

## 2017-09-13 NOTE — ED Triage Notes (Signed)
Pt here for URI symptoms since the weekend. Taking mucinex without relief.

## 2018-07-27 IMAGING — CR DG CHEST 2V
2 series · 2 of 2 positions shown · non-contrast
Comparison: Chest radiograph dated 12/04/2009

CLINICAL DATA: 56-year-old female with high blood pressure and cold
symptoms.

EXAM:
CHEST  2 VIEW

[w chest pa]
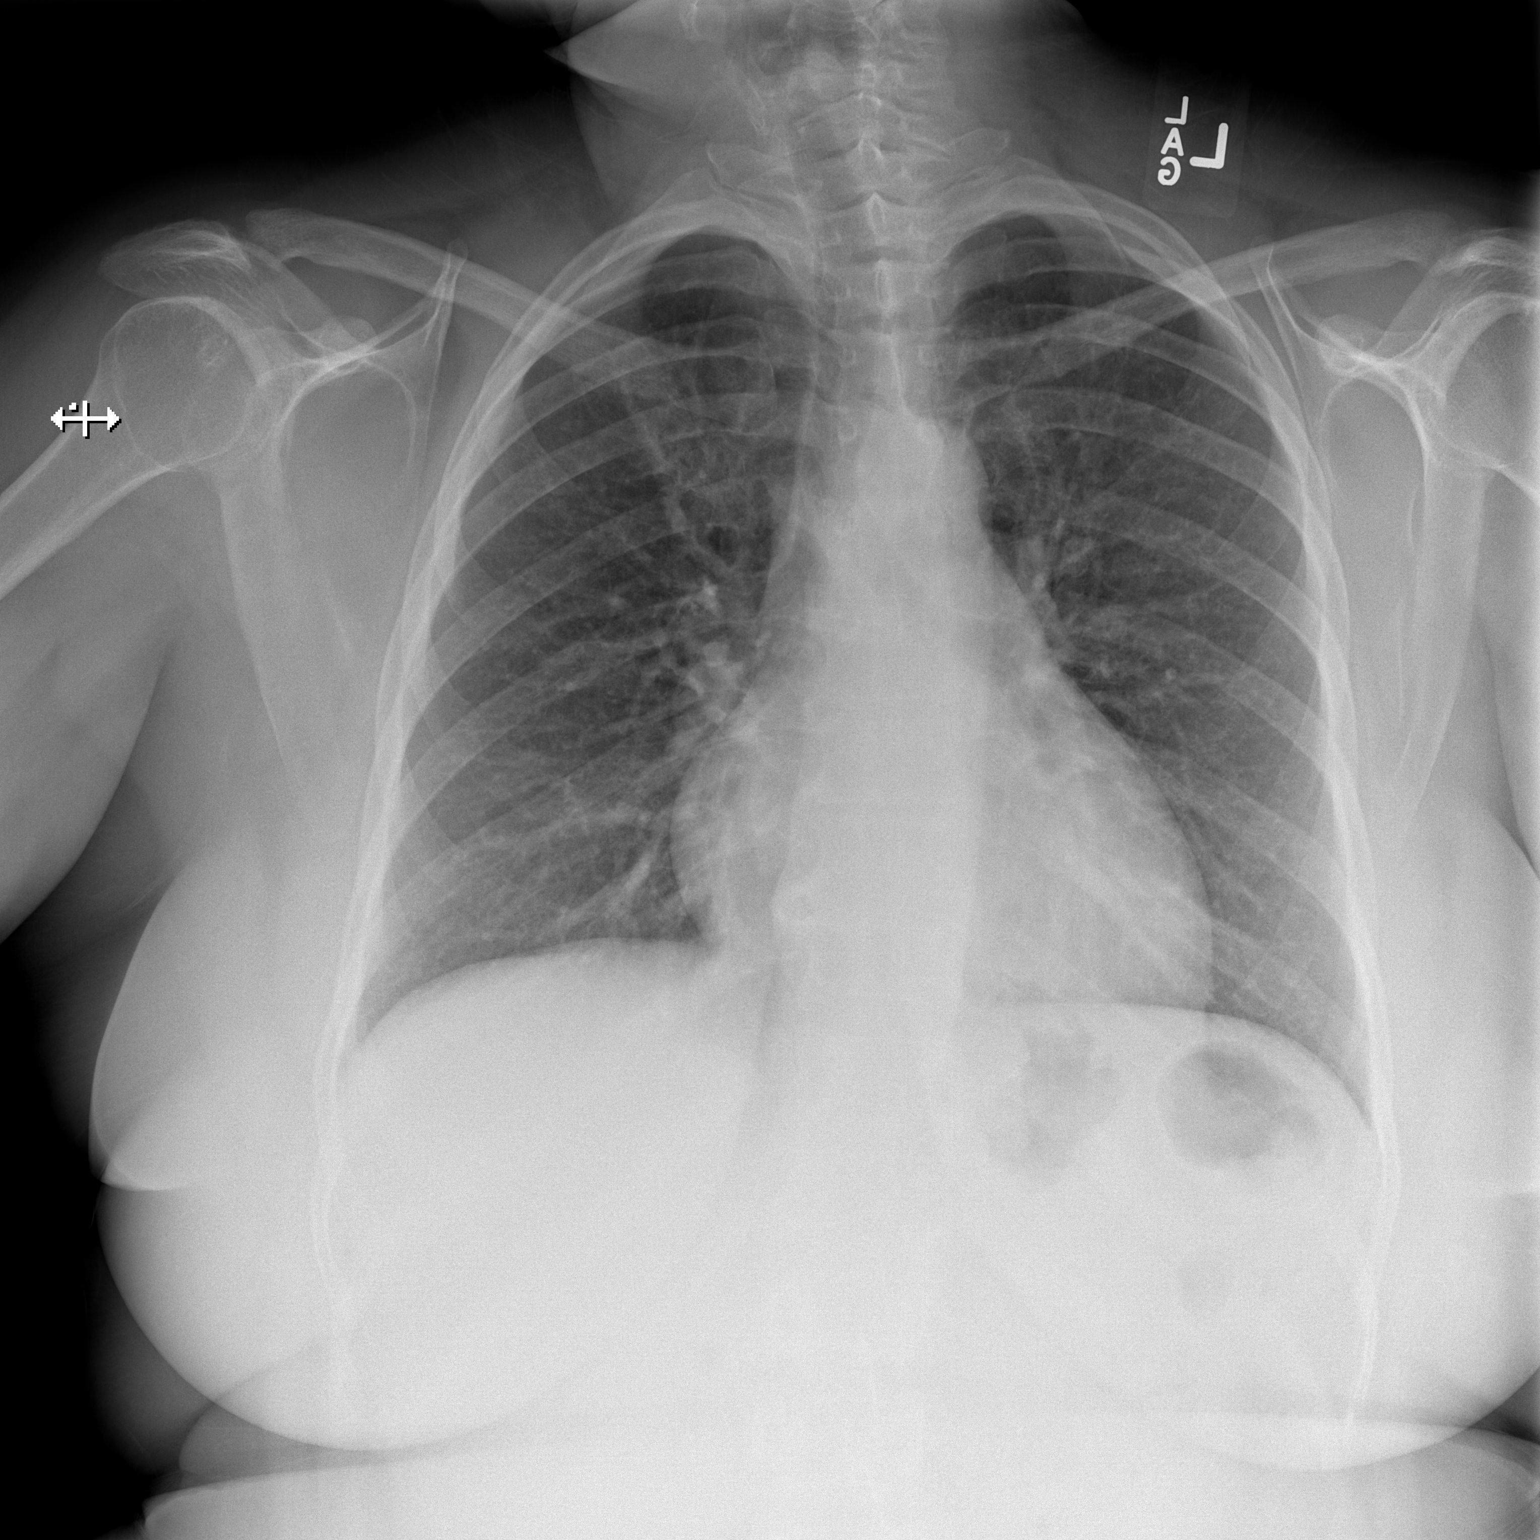

[w chest lat]
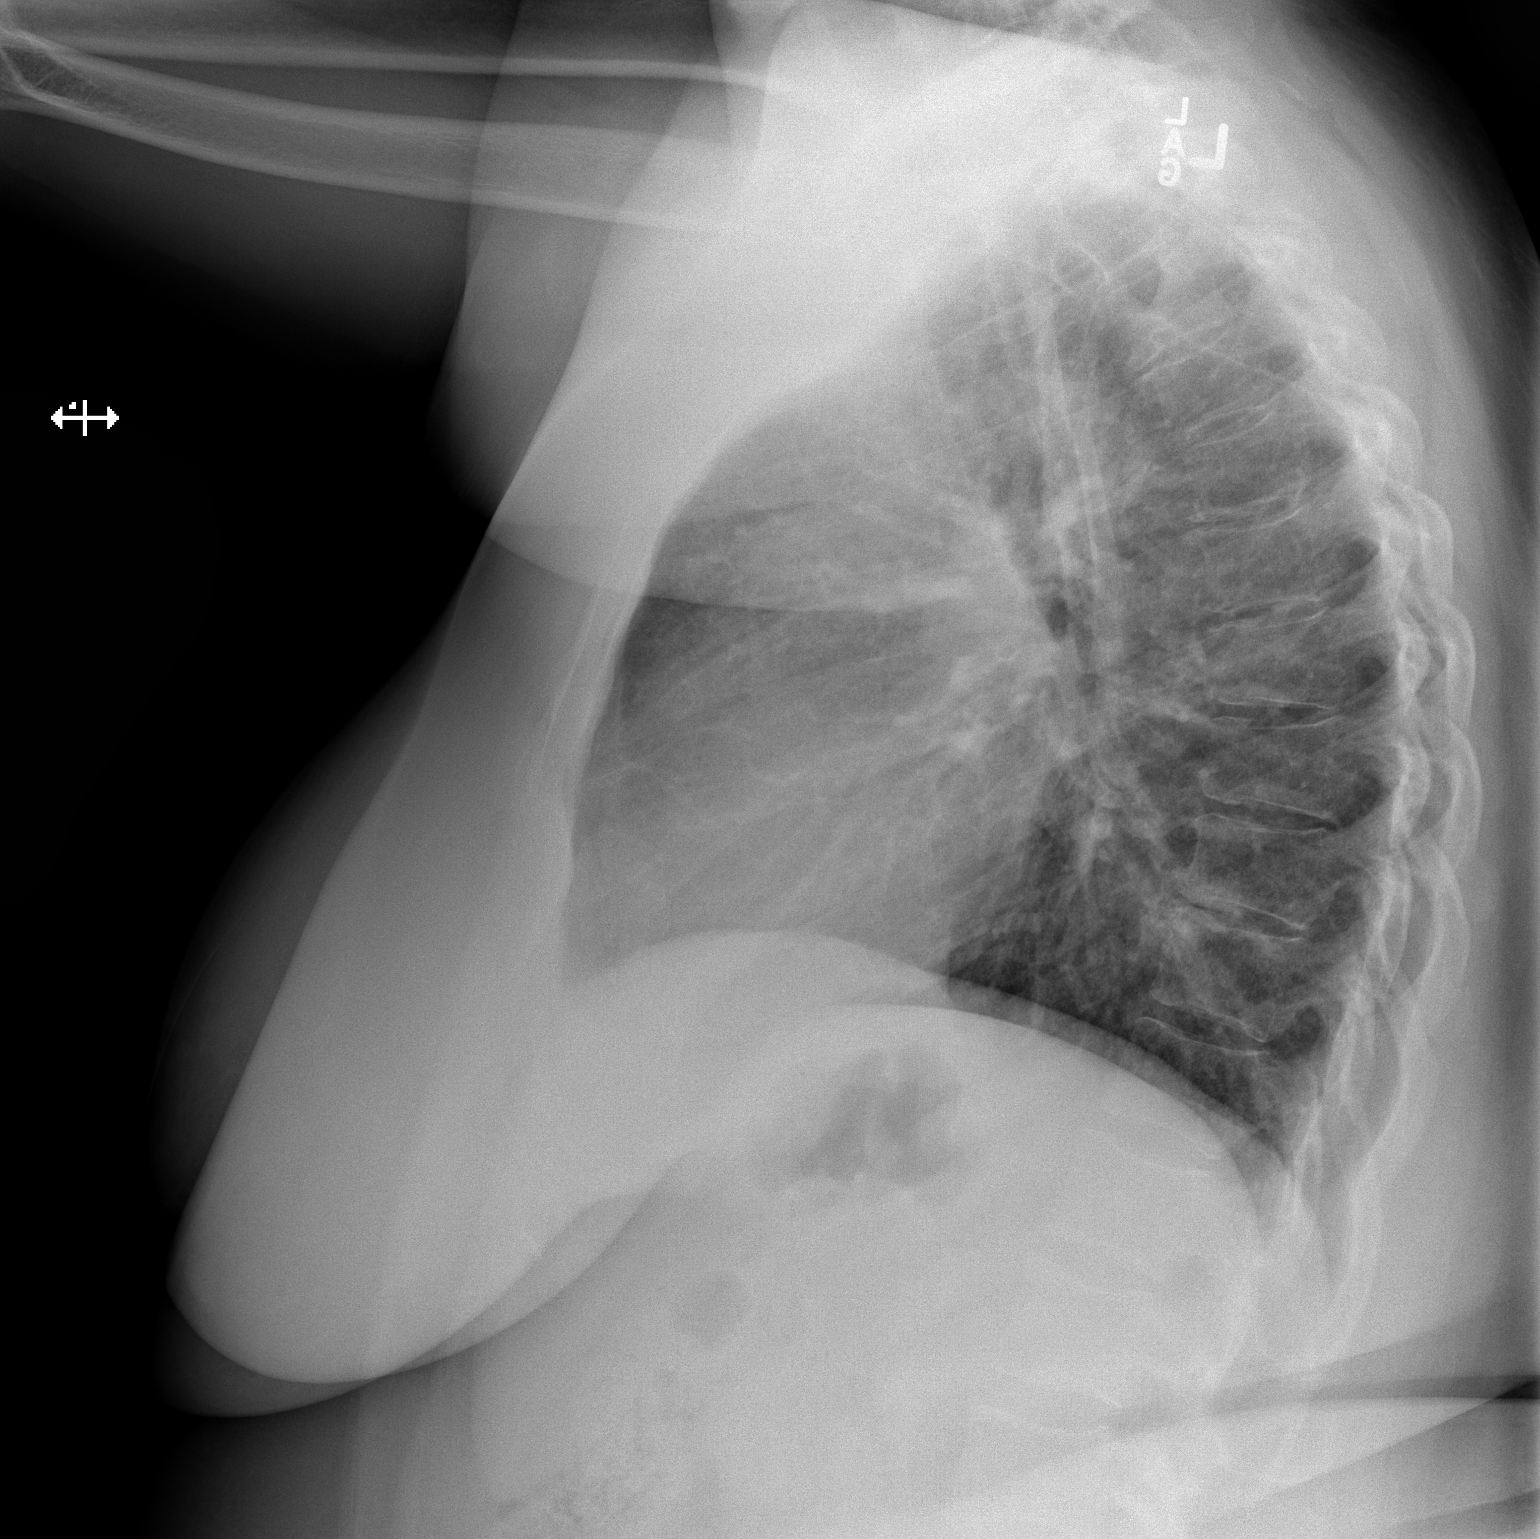

[2 of 2 positions shown; findings below may reference images not displayed]

FINDINGS: The heart size and mediastinal contours are within normal limits.
Both lungs are clear. The visualized skeletal structures are
unremarkable.
IMPRESSION: No active cardiopulmonary disease.

## 2020-04-23 ENCOUNTER — Emergency Department (HOSPITAL_BASED_OUTPATIENT_CLINIC_OR_DEPARTMENT_OTHER)
Admission: EM | Admit: 2020-04-23 | Discharge: 2020-04-23 | Disposition: A | Discharge status: Home / Self Care | Payer: PRIVATE HEALTH INSURANCE | Attending: Emergency Medicine | Admitting: Emergency Medicine

## 2020-04-23 ENCOUNTER — Emergency Department (HOSPITAL_BASED_OUTPATIENT_CLINIC_OR_DEPARTMENT_OTHER): Payer: PRIVATE HEALTH INSURANCE

## 2020-04-23 ENCOUNTER — Other Ambulatory Visit: Payer: Self-pay

## 2020-04-23 ENCOUNTER — Encounter (HOSPITAL_BASED_OUTPATIENT_CLINIC_OR_DEPARTMENT_OTHER): Payer: Self-pay | Admitting: *Deleted

## 2020-04-23 DIAGNOSIS — Y9301 Activity, walking, marching and hiking: Secondary | ICD-10-CM | POA: Insufficient documentation

## 2020-04-23 DIAGNOSIS — Z79899 Other long term (current) drug therapy: Secondary | ICD-10-CM | POA: Insufficient documentation

## 2020-04-23 DIAGNOSIS — Y999 Unspecified external cause status: Secondary | ICD-10-CM | POA: Insufficient documentation

## 2020-04-23 DIAGNOSIS — S93431A Sprain of tibiofibular ligament of right ankle, initial encounter: Secondary | ICD-10-CM | POA: Insufficient documentation

## 2020-04-23 DIAGNOSIS — Y929 Unspecified place or not applicable: Secondary | ICD-10-CM | POA: Insufficient documentation

## 2020-04-23 DIAGNOSIS — W010XXA Fall on same level from slipping, tripping and stumbling without subsequent striking against object, initial encounter: Secondary | ICD-10-CM | POA: Insufficient documentation

## 2020-04-23 DIAGNOSIS — S93491A Sprain of other ligament of right ankle, initial encounter: Secondary | ICD-10-CM

## 2020-04-23 DIAGNOSIS — F17210 Nicotine dependence, cigarettes, uncomplicated: Secondary | ICD-10-CM | POA: Insufficient documentation

## 2020-04-23 MED ORDER — OXYCODONE HCL 5 MG PO TABS
5.0000 mg | ORAL_TABLET | Freq: Once | ORAL | Status: DC
Start: 2020-04-23 — End: 2020-04-23

## 2020-04-23 MED ORDER — ACETAMINOPHEN 500 MG PO TABS
1000.0000 mg | ORAL_TABLET | Freq: Once | ORAL | Status: AC
  Administered 2020-04-23: 09:00:00 1000 mg via ORAL
  Filled 2020-04-23: qty 2

## 2020-04-23 NOTE — ED Provider Notes (Signed)
[facility] EMERGENCY DEPARTMENT Provider Note   CSN: [id number] Arrival date & time: 04/23/20  9179     History Chief Complaint  Patient presents with  . Fall    Haley Erickson is a 59 y.o. female.  59 yo F with a chief complaint of right ankle pain.  The patient was ambulating and lost her footing when she stepped on some grease.  Pain to the lateral aspect of the ankle.  Has had a surgery on that foot in the past and had some pain to it off and on but no pain currently.  She denies pain in the knee.  Denies other injury denies head injury loss consciousness chest pain back pain abdominal pain.  The history is provided by the patient.  Fall This is a new problem. The problem occurs constantly. The problem has not changed since onset.Pertinent negatives include no chest pain, no abdominal pain, no headaches and no shortness of breath. The symptoms are aggravated by bending, walking and twisting. Nothing relieves the symptoms. She has tried nothing for the symptoms. The treatment provided no relief.       History reviewed. No pertinent past medical history.  Patient Active Problem List   Diagnosis Date Noted  . Cough 09/03/2016  . Acute upper respiratory infection 09/03/2016    Past Surgical History:  Procedure Laterality Date  . FOOT SURGERY Right 05/13/2016  . TUBAL LIGATION       OB History   No obstetric history on file.     Family History  Problem Relation Age of Onset  . Diabetes Brother     Social History   Tobacco Use  . Smoking status: Current Some Day Smoker    Packs/day: 0.25    Types: Cigarettes  . Smokeless tobacco: Never Used  Substance Use Topics  . Alcohol use: Yes    Comment: occcasional  . Drug use: No    Home Medications Prior to Admission medications   Medication Sig Start Date End Date Taking? Authorizing Provider  albuterol (PROVENTIL HFA;VENTOLIN HFA) 108 (90 Base) MCG/ACT inhaler Inhale 2 puffs into the lungs every  4 (four) hours as needed for wheezing or shortness of breath. 09/13/17   Cathie Hoops, Amy V, PA-C  benzonatate (TESSALON) 100 MG capsule Take 1 capsule (100 mg total) by mouth every 8 (eight) hours. 09/13/17   Cathie Hoops, Amy V, PA-C  cetirizine (ZYRTEC) 10 MG tablet Take 1 tablet (10 mg total) by mouth daily. 09/13/17   Cathie Hoops, Amy V, PA-C  fluticasone (FLONASE) 50 MCG/ACT nasal spray Place 2 sprays into both nostrils daily. 09/13/17   Cathie Hoops, Amy V, PA-C  Ibuprofen-Famotidine (DUEXIS) 800-26.6 MG TABS Take 1 tablet by mouth 2 (two) times daily as needed. 06/09/17   Cammy Copa, MD  meloxicam (MOBIC) 7.5 MG tablet Take 1 tablet (7.5 mg total) by mouth daily. 09/13/17   Belinda Fisher, PA-C  Spacer/Aero-Holding Chambers (E-Z SPACER) inhaler Use as instructed 09/25/13   Sondra Barges, PA-C    Allergies    Patient has no known allergies.  Review of Systems   Review of Systems  Constitutional: Negative for chills and fever.  HENT: Negative for congestion and rhinorrhea.   Eyes: Negative for redness and visual disturbance.  Respiratory: Negative for shortness of breath and wheezing.   Cardiovascular: Negative for chest pain and palpitations.  Gastrointestinal: Negative for abdominal pain, nausea and vomiting.  Genitourinary: Negative for dysuria and urgency.  Musculoskeletal: Positive for arthralgias and myalgias.  Skin: Negative for pallor and wound.  Neurological: Negative for dizziness and headaches.    Physical Exam Updated Vital Signs BP (!) 144/117 (BP Location: Right Arm)   Pulse 96   Temp 98.2 F (36.8 C) (Oral)   Resp 20   LMP 05/22/2013   SpO2 100%   Physical Exam Vitals and nursing note reviewed.  Constitutional:      General: She is not in acute distress.    Appearance: She is well-developed. She is not diaphoretic.  HENT:     Head: Normocephalic and atraumatic.  Eyes:     Pupils: Pupils are equal, round, and reactive to light.  Cardiovascular:     Rate and Rhythm: Normal rate and regular  rhythm.     Heart sounds: No murmur heard.  No friction rub. No gallop.   Pulmonary:     Effort: Pulmonary effort is normal.     Breath sounds: No wheezing or rales.  Abdominal:     General: There is no distension.     Palpations: Abdomen is soft.     Tenderness: There is no abdominal tenderness.  Musculoskeletal:        General: Swelling and tenderness present.     Cervical back: Normal range of motion and neck supple.     Comments: Tenderness and swelling to the lateral aspect of the right ankle.  PMS intact distally.  No pain to the base of the fifth metatarsal or the navicular.  No pain at the knee.  Skin:    General: Skin is warm and dry.  Neurological:     Mental Status: She is alert and oriented to person, place, and time.  Psychiatric:        Behavior: Behavior normal.     ED Results / Procedures / Treatments   Labs (all labs ordered are listed, but only abnormal results are displayed) Labs Reviewed - No data to display  EKG None  Radiology DG Ankle Complete Right  Result Date: 04/23/2020 CLINICAL DATA:  Right ankle pain and swelling EXAM: RIGHT ANKLE - COMPLETE 3+ VIEW COMPARISON:  11/26/2004 FINDINGS: No acute fracture or dislocation. Ankle mortise is congruent. The talar joint space is maintained. Mild arthropathy within the midfoot. Bidirectional calcaneal enthesophytes. Benign cortical thickening and sclerosis along the lateral aspect of the distal tibial metaphysis is unchanged from 2006. There is soft tissue swelling overlying the lateral malleolus. IMPRESSION: Soft tissue swelling overlying the lateral malleolus. No evidence of acute fracture or dislocation. Electronically Signed   By: Duanne Guess D.O.   On: 04/23/2020 09:33    Procedures Procedures (including critical care time)  Medications Ordered in ED Medications  oxyCODONE (Oxy IR/ROXICODONE) immediate release tablet 5 mg (5 mg Oral Not Given 04/23/20 0928)  acetaminophen (TYLENOL) tablet 1,000 mg  (1,000 mg Oral Given 04/23/20 2992)    ED Course  I have reviewed the triage vital signs and the nursing notes.  Pertinent labs & imaging results that were available during my care of the patient were reviewed by me and considered in my medical decision making (see chart for details).    MDM Rules/Calculators/A&P                          59 yo F with a chief complaints of right ankle pain.  Patient has pain and swelling in the location that I would expect the anterior talofibular ligament to be.  Will obtain a plain film.  Plain  film viewed by me negative for fracture.  Placed in an ASO crutches PCP or orthopedic follow-up.  9:39 AM:  I have discussed the diagnosis/risks/treatment options with the patient and believe the pt to be eligible for discharge home to follow-up with PCP, ortho. We also discussed returning to the ED immediately if new or worsening sx occur. We discussed the sx which are most concerning (e.g., sudden worsening pain, fever, inability to tolerate by mouth) that necessitate immediate return. Medications administered to the patient during their visit and any new prescriptions provided to the patient are listed below.  Medications given during this visit Medications  oxyCODONE (Oxy IR/ROXICODONE) immediate release tablet 5 mg (5 mg Oral Not Given 04/23/20 0928)  acetaminophen (TYLENOL) tablet 1,000 mg (1,000 mg Oral Given 04/23/20 0867)     The patient appears reasonably screen and/or stabilized for discharge and I doubt any other medical condition or other [facility] requiring further screening, evaluation, or treatment in the ED at this time prior to discharge.   Final Clinical Impression(s) / ED Diagnoses Final diagnoses:  Sprain of anterior talofibular ligament of right ankle, initial encounter    Rx / DC Orders ED Discharge Orders    None       Melene Plan, DO 04/23/20 [phone]

## 2020-04-23 NOTE — ED Notes (Signed)
EDP made aware that patient drove herself here and stated to take her pain medicine at home.

## 2020-04-23 NOTE — Discharge Instructions (Signed)
Try to keep your weight off of the affected ankle.  Please follow-up with orthopedics or your family doctor in the office.  Take 4 over the counter ibuprofen tablets 3 times a day or 2 over-the-counter naproxen tablets twice a day for pain. Also take tylenol 1000mg (2 extra strength) four times a day.

## 2020-04-23 NOTE — ED Triage Notes (Signed)
Larey Seat this morning

## 2020-05-21 ENCOUNTER — Emergency Department (HOSPITAL_COMMUNITY)
Admission: EM | Admit: 2020-05-21 | Discharge: 2020-05-21 | Disposition: A | Discharge status: Home / Self Care | Payer: Self-pay | Attending: Emergency Medicine | Admitting: Emergency Medicine

## 2020-05-21 ENCOUNTER — Encounter (HOSPITAL_COMMUNITY): Payer: Self-pay | Admitting: *Deleted

## 2020-05-21 DIAGNOSIS — F430 Acute stress reaction: Secondary | ICD-10-CM

## 2020-05-21 DIAGNOSIS — I10 Essential (primary) hypertension: Secondary | ICD-10-CM | POA: Insufficient documentation

## 2020-05-21 DIAGNOSIS — F17210 Nicotine dependence, cigarettes, uncomplicated: Secondary | ICD-10-CM | POA: Insufficient documentation

## 2020-05-21 LAB — CBC
HCT: 47 % — ABNORMAL HIGH (ref 36.0–46.0)
Hemoglobin: 15.2 g/dL — ABNORMAL HIGH (ref 12.0–15.0)
MCH: 30.2 pg (ref 26.0–34.0)
MCHC: 32.3 g/dL (ref 30.0–36.0)
MCV: 93.4 fL (ref 80.0–100.0)
Platelets: 266 10*3/uL (ref 150–400)
RBC: 5.03 MIL/uL (ref 3.87–5.11)
RDW: 13.3 % (ref 11.5–15.5)
WBC: 8.2 10*3/uL (ref 4.0–10.5)
nRBC: 0 % (ref 0.0–0.2)

## 2020-05-21 LAB — BASIC METABOLIC PANEL
Anion gap: 9 (ref 5–15)
BUN: 13 mg/dL (ref 6–20)
CO2: 26 mmol/L (ref 22–32)
Calcium: 9.5 mg/dL (ref 8.9–10.3)
Chloride: 104 mmol/L (ref 98–111)
Creatinine, Ser: 0.68 mg/dL (ref 0.44–1.00)
GFR calc Af Amer: >60 mL/min (ref >60)
GFR calc non Af Amer: >60 mL/min (ref >60)
Glucose, Bld: 129 mg/dL — ABNORMAL HIGH (ref 70–99)
Potassium: 4 mmol/L (ref 3.5–5.1)
Sodium: 139 mmol/L (ref 135–145)

## 2020-05-21 MED ORDER — HYDROXYZINE HCL 25 MG PO TABS
25.0000 mg | ORAL_TABLET | Freq: Once | ORAL | Status: AC
  Administered 2020-05-21: 10:00:00 25 mg via ORAL
  Filled 2020-05-21: qty 1

## 2020-05-21 MED ORDER — HYDROXYZINE HCL 25 MG PO TABS: 25.0000 mg | ORAL_TABLET | Freq: Three times a day (TID) | ORAL | 0 refills | Status: DC | PRN

## 2020-05-21 MED ORDER — HYDROCHLOROTHIAZIDE 25 MG PO TABS: 25.0000 mg | ORAL_TABLET | Freq: Every day | ORAL | 1 refills | Status: DC

## 2020-05-21 NOTE — Discharge Instructions (Signed)
Start taking the blood pressure medication.  Take it in the morning as it will cause you to urinate more.  Follow up with a primary care doctor to check on your blood pressure.  Contact a mental health provider to schedule an appointment to help you deal with your stress.

## 2020-05-21 NOTE — ED Triage Notes (Signed)
To ED via [facility], from home, for eval of HTN. States she got up this am to use the restroom and felt a little dizzy. Took her BP with a home machine and found to have htn. States she has been under a lot of stress with little sleep and min food intake. No sob. No cp. No dizzy now. Pt states she has a slight headache from crying yesterday. Appears in nad. No neuro deficits.

## 2020-05-21 NOTE — ED Provider Notes (Addendum)
[facility] EMERGENCY DEPARTMENT Provider Note   CSN: [id number] Arrival date & time: 05/21/20  0516     History Chief Complaint  Patient presents with  . Hypertension    Haley Erickson is a 59 y.o. female.  HPI   Patient presents to the ED for evaluation of stress and anxiety.  Patient states she has been having a lot of stress recently with her daughter.  This is making her feel overwhelmed and anxious.  Patient states she when she got up to use the bathroom this morning she was feeling a little bit lightheaded.  She took her blood pressure and it was elevated.  Patient called EMS and was brought to the ED.  Patient states she has been crying a lot because of the stress from her daughter.  She denies any trouble with numbness or weakness.  No trouble with her speech.  She was having dizziness earlier but nothing now.  Patient denies chest pain or shortness of breath.  Patient denies history of hypertension.  Patient does not see a primary care doctor for checkups.  She states normally her blood pressure is fine.  Previous vitals at urgent care visits have shown blood pressures of 144/117 in August, 155/94 in January 2019 and 188/98 in July 2018  History reviewed. No pertinent past medical history.  Patient Active Problem List   Diagnosis Date Noted  . Cough 09/03/2016  . Acute upper respiratory infection 09/03/2016    Past Surgical History:  Procedure Laterality Date  . FOOT SURGERY Right 05/13/2016  . TUBAL LIGATION       OB History   No obstetric history on file.     Family History  Problem Relation Age of Onset  . Diabetes Brother     Social History   Tobacco Use  . Smoking status: Current Some Day Smoker    Packs/day: 0.25    Types: Cigarettes  . Smokeless tobacco: Never Used  Substance Use Topics  . Alcohol use: Yes    Comment: occcasional  . Drug use: No    Home Medications Prior to Admission medications   Medication Sig Start  Date End Date Taking? Authorizing Provider  hydrochlorothiazide (HYDRODIURIL) 25 MG tablet Take 1 tablet (25 mg total) by mouth daily. 05/21/20   Linwood Dibbles, MD  hydrOXYzine (ATARAX/VISTARIL) 25 MG tablet Take 1 tablet (25 mg total) by mouth every 8 (eight) hours as needed for anxiety. 05/21/20   Linwood Dibbles, MD    Allergies    Patient has no known allergies.  Review of Systems   Review of Systems  All other systems reviewed and are negative.   Physical Exam Updated Vital Signs BP (!) 178/81 (BP Location: Right Arm)   Pulse 80   Temp 99.3 F (37.4 C) (Oral)   Resp 16   LMP 05/22/2013   SpO2 99%   Physical Exam Vitals and nursing note reviewed.  Constitutional:      General: She is not in acute distress.    Appearance: She is well-developed.  HENT:     Head: Normocephalic and atraumatic.     Right Ear: External ear normal.     Left Ear: External ear normal.  Eyes:     General: No scleral icterus.       Right eye: No discharge.        Left eye: No discharge.     Conjunctiva/sclera: Conjunctivae normal.  Neck:     Trachea: No tracheal deviation.  Cardiovascular:     Rate and Rhythm: Normal rate and regular rhythm.  Pulmonary:     Effort: Pulmonary effort is normal. No respiratory distress.     Breath sounds: Normal breath sounds. No stridor. No wheezing or rales.  Abdominal:     General: Bowel sounds are normal. There is no distension.     Palpations: Abdomen is soft.     Tenderness: There is no abdominal tenderness. There is no guarding or rebound.  Musculoskeletal:        General: No tenderness.     Cervical back: Neck supple.  Skin:    General: Skin is warm and dry.     Findings: No rash.  Neurological:     General: No focal deficit present.     Mental Status: She is alert and oriented to person, place, and time.     Cranial Nerves: No cranial nerve deficit (no facial droop, extraocular movements intact, no slurred speech).     Sensory: No sensory deficit.      Motor: No abnormal muscle tone or seizure activity.     Coordination: Coordination normal.     Comments: No finger-to-nose exam, normal strength and sensation throughout  Psychiatric:        Mood and Affect: Mood is anxious.        Speech: Speech normal.        Behavior: Behavior is not aggressive or hyperactive.        Thought Content: Thought content does not include homicidal or suicidal ideation.     ED Results / Procedures / Treatments   Labs (all labs ordered are listed, but only abnormal results are displayed) Labs Reviewed  BASIC METABOLIC PANEL - Abnormal; Notable for the following components:      Result Value   Glucose, Bld 129 (*)    All other components within normal limits  CBC - Abnormal; Notable for the following components:   Hemoglobin 15.2 (*)    HCT 47.0 (*)    All other components within normal limits    EKG EKG Interpretation  Date/Time:  Wednesday May 21 2020 05:44:29 EDT Ventricular Rate:  74 PR Interval:  134 QRS Duration: 80 QT Interval:  436 QTC Calculation: 483 R Axis:   41 Text Interpretation: Normal sinus rhythm Septal infarct , age undetermined Abnormal ECG No old tracing to compare Confirmed by Dione Booze ([id number]) on 05/21/2020 6:10:13 AM   Radiology No results found.  Procedures Procedures (including critical care time)  Medications Ordered in ED Medications  hydrOXYzine (ATARAX/VISTARIL) tablet 25 mg (25 mg Oral Given 05/21/20 0944)    ED Course  I have reviewed the triage vital signs and the nursing notes.  Pertinent labs & imaging results that were available during my care of the patient were reviewed by me and considered in my medical decision making (see chart for details).  Clinical Course as of May 21 1008  Wed May 21, 2020  0833 CBC and metabolic panel normal.   [JK]    Clinical Course User Index [JK] Linwood Dibbles, MD   MDM Rules/Calculators/A&P                         Patient presents the ED with complaints  of anxiety and hypertension.  I reviewed previous records and patient appears to have some chronically elevated blood pressure.  She has not been going to a primary care doctor.  I suspect this has not been formally  diagnosed but she likely has a component of chronic hypertension.  Patient is not having any focal neurologic symptoms.  No signs to suggest hypertensive emergency or urgency.  I will discharge her home on a low-dose diuretic and recommend outpatient follow-up with a primary care doctor.  Regarding the patient's stress, she is not suicidal homicidal.  Do think she would benefit from outpatient psychiatric consultation and assistance.  I will provide her a telephone number for the [facility] behavioral health center.  Final Clinical Impression(s) / ED Diagnoses Final diagnoses:  Hypertension, unspecified type  Stress reaction    Rx / DC Orders ED Discharge Orders         Ordered    hydrOXYzine (ATARAX/VISTARIL) 25 MG tablet  Every 8 hours PRN        05/21/20 1007    hydrochlorothiazide (HYDRODIURIL) 25 MG tablet  Daily        05/21/20 1007           Linwood Dibbles, MD 05/21/20 1009    Linwood Dibbles, MD 05/21/20 1009

## 2020-06-11 NOTE — Progress Notes (Signed)
Patient ID: Haley Erickson, female   DOB: [birth year], 59 y.o.   MRN: [id number]   Virtual Visit via Telephone Note  I connected with AUTUM BENFER on 06/12/20 at  2:30 PM EDT by telephone and verified that I am speaking with the correct person using two identifiers.   I discussed the limitations, risks, security and privacy concerns of performing an evaluation and management service by telephone and the availability of in person appointments. I also discussed with the patient that there may be a patient responsible charge related to this service. The patient expressed understanding and agreed to proceed.  PATIENT visit by telephone virtually in the context of Covid-19 pandemic. Patient location:   Home My Location:  [facility] office Persons on the call:  Me and the patient   History of Present Illness: After ED visit 05/21/2020 for anxiety and BP was elevated.  Seems to be an ongoing unaddressed issue.  Anxiety was treated with hydroxyzine.  Glucose was 129.  Hydroxyzine is helping with anxiety.  She has not been on meds in the past.  She is open to talking with our Child psychotherapist for stress management techniques.  She is trying to quit smoking(smokin 3-4 daily).  She is exercising and improving her diet.  Compliant with BP meds and nor further HA.    From ED note: Patient presents the ED with complaints of anxiety and hypertension.  I reviewed previous records and patient appears to have some chronically elevated blood pressure.  She has not been going to a primary care doctor.  I suspect this has not been formally diagnosed but she likely has a component of chronic hypertension.  Patient is not having any focal neurologic symptoms.  No signs to suggest hypertensive emergency or urgency.  I will discharge her home on a low-dose diuretic and recommend outpatient follow-up with a primary care doctor.  Regarding the patient's stress, she is not suicidal homicidal.  Do think she would benefit  from outpatient psychiatric consultation and assistance.  I will provide her a telephone number for the [facility] behavioral health center.  Observations/Objective:  NAD.  A&Ox3   Assessment and Plan: 1. Anxiety - Ambulatory referral to Social Work - hydrOXYzine (ATARAX/VISTARIL) 25 MG tablet; Take 1 tablet (25 mg total) by mouth every 8 (eight) hours as needed for anxiety.  Dispense: 30 tablet; Refill: 1  2. Hypertension, unspecified type Controlled-continue - hydrochlorothiazide (HYDRODIURIL) 25 MG tablet; Take 1 tablet (25 mg total) by mouth daily.  Dispense: 30 tablet; Refill: 1  3. Smoking Smoking and dangers of nicotine have been discussed at length. Long term health consequences of smoking reviewed in detail.  Methods for helping with cessation have been reviewed.  Patient expresses understanding.  - Ambulatory referral to Social Work    Follow Up Instructions: Assign PCP in 2-3 months   I discussed the assessment and treatment plan with the patient. The patient was provided an opportunity to ask questions and all were answered. The patient agreed with the plan and demonstrated an understanding of the instructions.   The patient was advised to call back or seek an in-person evaluation if the symptoms worsen or if the condition fails to improve as anticipated.  I provided 15 minutes of non-face-to-face time during this encounter.   Georgian Co, PA-C

## 2020-06-12 ENCOUNTER — Other Ambulatory Visit: Payer: Self-pay

## 2020-06-12 ENCOUNTER — Ambulatory Visit: Payer: Self-pay | Attending: Physician Assistant | Admitting: Physician Assistant

## 2020-06-12 ENCOUNTER — Encounter: Payer: Self-pay | Admitting: Physician Assistant

## 2020-06-12 DIAGNOSIS — I10 Essential (primary) hypertension: Secondary | ICD-10-CM

## 2020-06-12 DIAGNOSIS — F17200 Nicotine dependence, unspecified, uncomplicated: Secondary | ICD-10-CM

## 2020-06-12 DIAGNOSIS — F419 Anxiety disorder, unspecified: Secondary | ICD-10-CM

## 2020-06-12 MED ORDER — HYDROCHLOROTHIAZIDE 25 MG PO TABS: 25.0000 mg | ORAL_TABLET | Freq: Every day | ORAL | 1 refills | Status: DC

## 2020-06-12 MED ORDER — HYDROXYZINE HCL 25 MG PO TABS: 25.0000 mg | ORAL_TABLET | Freq: Three times a day (TID) | ORAL | 1 refills | Status: DC | PRN

## 2020-06-25 ENCOUNTER — Telehealth: Payer: Self-pay | Admitting: Licensed Clinical Social Worker

## 2020-06-25 NOTE — Telephone Encounter (Signed)
MSW Intern called Pt in respose to PCP referral to Social Work services; Pt wanted to receive a callback at a later time to make an appt once they had their work schedule.

## 2020-06-27 ENCOUNTER — Telehealth: Payer: Self-pay | Admitting: Licensed Clinical Social Worker

## 2020-06-27 NOTE — Telephone Encounter (Signed)
MSW Intern called Pt back after she had received her work schedule and Pt was able to make an appt with Jasmine over the phone for Monday 11/1 at 1:30pm.

## 2020-06-30 ENCOUNTER — Ambulatory Visit: Payer: Self-pay | Attending: Family Medicine | Admitting: Licensed Clinical Social Worker

## 2020-06-30 ENCOUNTER — Other Ambulatory Visit: Payer: Self-pay

## 2020-06-30 DIAGNOSIS — F411 Generalized anxiety disorder: Secondary | ICD-10-CM

## 2020-06-30 NOTE — BH Specialist Note (Signed)
Integrated Behavioral Health Visit via Telemedicine (Telephone)  06/30/2020 Haley Erickson [id number]  Number of Integrated Behavioral Health visits: 1 Session Start time: 1:30 PM  Session End time: 1:50 PM Total time: 20 minutes  Referring Provider: Trena Platt Type of Service: Individual Patient location: Home [facility] Provider location: Office All persons participating in visit: LCSW and patient   I connected with Haley Erickson by telephone and verified that I am speaking with the correct person using two identifiers.   Discussed confidentiality: Yes   Confirmed demographics & insurance:  Yes   I discussed that engaging in this virtual visit, they consent to the provision of behavioral healthcare and the services will be billed under their insurance.   Patient and/or legal guardian expressed understanding and consented to virtual visit: Yes   PRESENTING CONCERNS: Patient or family reports the following symptoms/concerns: Pt reports difficulty managing anxiety symptoms including excessive worry about children and grandchildren, racing thoughts, and difficulty staying asleep. Psychosocial stressors including high risk for losing housing due to financial strains. Pt has applied for various emergency rental assistance.  Duration of problem: Ongoing; Severity of problem: moderate  STRENGTHS (Protective Factors/Coping Skills): Social connections, Social and Emotional competence, Concrete supports in place (healthy food, safe environments, etc.) and Sense of purpose  ASSESSMENT: Patient currently experiencing difficulty managing anxiety symptoms triggered by psychosocial stressors. This has negatively impacted pt's ability to also manage hypertension.   Pt will benefit from therapy and continued medication management.     GOALS ADDRESSED: Patient will: 1.  Reduce symptoms of: anxiety Pt agreed to continue compliance with medication  2.  Increase knowledge and/or ability of:  coping skills Pt agreed to continue utilizing healthy coping skills.    Progress of Goals: Ongoing  INTERVENTIONS: Interventions utilized:  Solution-Focused Strategies, Supportive Counseling and Psychoeducation and/or Health Education Standardized Assessments completed & reviewed: Not Needed   OUTCOME: Patient Response: Pt was engaged during session and was successful in identifying healthy coping skills to assist in management of symptoms. Reports that she has significantly cut down in cigarettes and drinks wine occasionally Pt agreed to follow up with [employer], DSS, and [employer]   PLAN: 1. Follow up with behavioral health clinician on : Contact LCSW with additional behavioral health and/or resource needs 2. Behavioral recommendations: Utilize strategies discussed, comply with meds, and follow up with community agencies 3. Referral(s): Integrated [employer] (In Clinic)  I discussed the assessment and treatment plan with the patient and/or parent/guardian. They were provided an opportunity to ask questions and all were answered. They agreed with the plan and demonstrated an understanding of the instructions.   They were advised to call back or seek an in-person evaluation as appropriate.  I discussed that the purpose of this visit is to provide behavioral health care while limiting exposure to the novel coronavirus.  Discussed there is a possibility of technology failure and discussed alternative modes of communication if that failure occurs.  Bridgett Larsson, LCSW 07/11/2020 3:31 PM

## 2020-07-28 ENCOUNTER — Telehealth: Payer: Self-pay | Admitting: General Practice

## 2020-07-28 DIAGNOSIS — I10 Essential (primary) hypertension: Secondary | ICD-10-CM

## 2020-07-28 MED ORDER — HYDROCHLOROTHIAZIDE 25 MG PO TABS: 25.0000 mg | ORAL_TABLET | Freq: Every day | ORAL | 1 refills | Status: DC

## 2020-07-28 NOTE — Telephone Encounter (Signed)
Pt needs refill for medication hydrochlorothiazide (HYDRODIURIL) 25/ please advise   Send to [id number] [place], [place] - [address] AT [facility] [address]  [address] [place], [place] [address]  Phone:  [phone] Fax:  [phone]

## 2020-07-28 NOTE — Telephone Encounter (Signed)
Rx sent 

## 2020-07-31 ENCOUNTER — Ambulatory Visit: Payer: Self-pay | Admitting: Family Medicine

## 2020-08-04 ENCOUNTER — Telehealth: Payer: Self-pay | Admitting: General Practice

## 2020-08-04 NOTE — Telephone Encounter (Signed)
Patient is calling to schedule an appt with Jenel Lucks. Please advise

## 2020-08-04 NOTE — Telephone Encounter (Signed)
I return the Pt call, spoke with the Pt she want a virtual appt which I schedule

## 2020-08-13 ENCOUNTER — Ambulatory Visit: Payer: Self-pay | Admitting: Licensed Clinical Social Worker

## 2020-08-13 ENCOUNTER — Telehealth: Payer: Self-pay | Admitting: Licensed Clinical Social Worker

## 2020-08-13 ENCOUNTER — Other Ambulatory Visit: Payer: Self-pay

## 2020-08-13 NOTE — Telephone Encounter (Signed)
Call placed regarding scheduled IBH appointment. LCSW left message requesting a return call.  

## 2020-08-27 ENCOUNTER — Ambulatory Visit: Payer: Self-pay | Attending: Family Medicine | Admitting: Licensed Clinical Social Worker

## 2020-08-27 ENCOUNTER — Other Ambulatory Visit: Payer: Self-pay

## 2020-08-27 DIAGNOSIS — Z599 Problem related to housing and economic circumstances, unspecified: Secondary | ICD-10-CM

## 2020-09-03 ENCOUNTER — Ambulatory Visit: Payer: Self-pay | Attending: Family Medicine | Admitting: Licensed Clinical Social Worker

## 2020-09-03 ENCOUNTER — Encounter: Payer: Self-pay | Admitting: Physician Assistant

## 2020-09-03 ENCOUNTER — Other Ambulatory Visit: Payer: Self-pay

## 2020-09-03 ENCOUNTER — Ambulatory Visit: Payer: Self-pay | Attending: Physician Assistant | Admitting: Physician Assistant

## 2020-09-03 VITALS — BP 172/80 | HR 67 | Temp 98.7°F | Resp 16 | Wt 278.6 lb

## 2020-09-03 DIAGNOSIS — F411 Generalized anxiety disorder: Secondary | ICD-10-CM

## 2020-09-03 DIAGNOSIS — Z1331 Encounter for screening for depression: Secondary | ICD-10-CM

## 2020-09-03 DIAGNOSIS — F419 Anxiety disorder, unspecified: Secondary | ICD-10-CM

## 2020-09-03 DIAGNOSIS — R739 Hyperglycemia, unspecified: Secondary | ICD-10-CM

## 2020-09-03 DIAGNOSIS — I10 Essential (primary) hypertension: Secondary | ICD-10-CM

## 2020-09-03 MED ORDER — SERTRALINE HCL 50 MG PO TABS: 50.0000 mg | ORAL_TABLET | Freq: Every day | ORAL | 3 refills | Status: DC

## 2020-09-03 MED ORDER — HYDROXYZINE HCL 25 MG PO TABS: 25.0000 mg | ORAL_TABLET | Freq: Three times a day (TID) | ORAL | 1 refills | Status: AC | PRN

## 2020-09-03 MED ORDER — LISINOPRIL-HYDROCHLOROTHIAZIDE 20-25 MG PO TABS
1.0000 | ORAL_TABLET | Freq: Every day | ORAL | 3 refills | Status: DC
Start: 2020-09-03 — End: 2021-09-24

## 2020-09-03 NOTE — Progress Notes (Signed)
Haley Erickson, is a 60 y.o. female  ELF:[id number]  HEN:[id number]  DOB - 01-15-1961  Subjective:  Chief Complaint and HPI: Haley Erickson is a 60 y.o. female here today for htn and anxiety and wants to meet with [place].  Vistaril helps some with anxiety but admits to having a lot of anxiety.  I discussed this at length with the patient and [place] and we agreed to try her on SSRI.    BP not controlled.  usu 150-170/85-95 when she checks it at home.  No CP/Dizziness/SOB.  Compliant with HCTZ and tolerates ok.   She has had hyperglycemia in the past and has been trying to work on eating better.     ROS:   Constitutional:  No f/c, No night sweats, No unexplained weight loss. EENT:  No vision changes, No blurry vision, No hearing changes. No mouth, throat, or ear problems.  Respiratory: No cough, No SOB Cardiac: No CP, no palpitations GI:  No abd pain, No N/V/D. GU: No Urinary s/sx Musculoskeletal: No joint pain Neuro: No headache, no dizziness, no motor weakness.  Skin: No rash Endocrine:  No polydipsia. No polyuria.  Psych: Denies SI/HI,  +anxiety and some depression  No problems updated.  ALLERGIES: No Known Allergies  PAST MEDICAL HISTORY: No past medical history on file.  MEDICATIONS AT HOME: Prior to Admission medications   Medication Sig Start Date End Date Taking? Authorizing Provider  lisinopril-hydrochlorothiazide (ZESTORETIC) 20-25 MG tablet Take 1 tablet by mouth daily. 09/03/20  Yes Freeman Caldron M, PA-C  sertraline (ZOLOFT) 50 MG tablet Take 1 tablet (50 mg total) by mouth daily. 09/03/20  Yes Argentina Donovan, PA-C  hydrOXYzine (ATARAX/VISTARIL) 25 MG tablet Take 1 tablet (25 mg total) by mouth every 8 (eight) hours as needed for anxiety. 09/03/20   Argentina Donovan, PA-C     Objective:  EXAM:   Vitals:   09/03/20 1023  BP: (!) 172/80  Pulse: 67  Resp: 16  Temp: 98.7 F (37.1 C)  SpO2: 98%  Weight: 278 lb 9.6 oz (126.4 kg)    General  appearance : A&OX3. NAD. Non-toxic-appearing HEENT: Atraumatic and Normocephalic.  PERRLA. EOM intact.  Neck: supple, no JVD. No cervical lymphadenopathy. No thyromegaly Chest/Lungs:  Breathing-non-labored, Good air entry bilaterally, breath sounds normal without rales, rhonchi, or wheezing  CVS: S1 S2 regular, no murmurs, gallops, rubs  Abdomen: Bowel sounds present, Non tender and not distended with no gaurding, rigidity or rebound. Extremities: Bilateral Lower Ext shows no edema, both legs are warm to touch with = pulse throughout Neurology:  CN II-XII grossly intact, Non focal.   Psych:  TP linear. J/I fair. Normal speech. Appropriate eye contact and affect. laughs periodically Skin:  No Rash  Depression screen [facility] 2/9 09/03/2020 09/03/2016  Decreased Interest 2 0  Down, Depressed, Hopeless 2 0  PHQ - 2 Score 4 0  Altered sleeping 3 -  Tired, decreased energy 1 -  Change in appetite 0 -  Feeling bad or failure about yourself  3 -  Trouble concentrating 2 -  Moving slowly or fidgety/restless 0 -  Suicidal thoughts 2 -  PHQ-9 Score 15 -    GAD 7 : Generalized Anxiety Score 09/03/2020 06/30/2020  Nervous, Anxious, on Edge 3 1  Control/stop worrying 3 3  Worry too much - different things 3 2  Trouble relaxing 3 3  Restless 2 2  Easily annoyed or irritable 2 3  Afraid - awful might happen 3 3  Total GAD 7 Score 19 17      Data Review No results found for: HGBA1C   Assessment & Plan   1. Hypertension, unspecified type Uncontrolled-stop HCTZ.  Check BP at least 3 times weekly and record and bring to next visit - Comprehensive metabolic panel - Thyroid Panel With TSH - Vitamin D, 25-hydroxy - lisinopril-hydrochlorothiazide (ZESTORETIC) 20-25 MG tablet; Take 1 tablet by mouth daily.  Dispense: 90 tablet; Refill: 3  2. GAD (generalized anxiety disorder) Met with Christa See today and we decided on SSRI- - Thyroid Panel With TSH - Vitamin D, 25-hydroxy - hydrOXYzine  (ATARAX/VISTARIL) 25 MG tablet; Take 1 tablet (25 mg total) by mouth every 8 (eight) hours as needed for anxiety.  Dispense: 30 tablet; Refill: 1 - sertraline (ZOLOFT) 50 MG tablet; Take 1 tablet (50 mg total) by mouth daily.  Dispense: 30 tablet; Refill: 3  3. Hyperglycemia I have had a lengthy discussion and provided education about insulin resistance and the intake of too much sugar/refined carbohydrates.  I have advised the patient to work at a goal of eliminating sugary drinks, candy, desserts, sweets, refined sugars, processed foods, and white carbohydrates.  The patient expresses understanding.   - Hemoglobin A1c  4. Anxiety Will try - sertraline (ZOLOFT) 50 MG tablet; Take 1 tablet (50 mg total) by mouth daily.  Dispense: 30 tablet; Refill: 3  5. Positive depression/anxiety screening She met with LCSW today     Patient have been counseled extensively about nutrition and exercise  Return for 3 weeks with Lurena Joiner for BP and Jasmine for anxiety and 2 months needs to be assigned a PCP.  The patient was given clear instructions to go to ER or return to medical center if symptoms don't improve, worsen or new problems develop. The patient verbalized understanding. The patient was told to call to get lab results if they haven't heard anything in the next week.     Freeman Caldron, PA-C [facility], [place]   09/03/2020, 11:18 AMPatient ID: Haley Erickson, female   DOB: October 03, 1960, 60 y.o.   MRN: [id number]

## 2020-09-04 LAB — COMPREHENSIVE METABOLIC PANEL
ALT: 14 IU/L (ref 0–32)
AST: 12 IU/L (ref 0–40)
Albumin/Globulin Ratio: 1.6 (ref 1.2–2.2)
Albumin: 4.2 g/dL (ref 3.8–4.9)
Alkaline Phosphatase: 107 IU/L (ref 44–121)
BUN/Creatinine Ratio: 14 (ref 9–23)
BUN: 10 mg/dL (ref 6–24)
Bilirubin Total: 0.3 mg/dL (ref 0.0–1.2)
CO2: 23 mmol/L (ref 20–29)
Calcium: 9.9 mg/dL (ref 8.7–10.2)
Chloride: 99 mmol/L (ref 96–106)
Creatinine, Ser: 0.69 mg/dL (ref 0.57–1.00)
GFR calc Af Amer: 112 mL/min/{1.73_m2} (ref >59)
GFR calc non Af Amer: 97 mL/min/{1.73_m2} (ref >59)
Globulin, Total: 2.7 g/dL (ref 1.5–4.5)
Glucose: 94 mg/dL (ref 65–99)
Potassium: 4.6 mmol/L (ref 3.5–5.2)
Sodium: 141 mmol/L (ref 134–144)
Total Protein: 6.9 g/dL (ref 6.0–8.5)

## 2020-09-04 LAB — HEMOGLOBIN A1C
Est. average glucose Bld gHb Est-mCnc: 134 mg/dL
Hgb A1c MFr Bld: 6.3 % — ABNORMAL HIGH (ref 4.8–5.6)

## 2020-09-04 LAB — VITAMIN D 25 HYDROXY (VIT D DEFICIENCY, FRACTURES): Vit D, 25-Hydroxy: 18.2 ng/mL — ABNORMAL LOW (ref 30.0–100.0)

## 2020-09-04 LAB — THYROID PANEL WITH TSH
Free Thyroxine Index: 2.1 (ref 1.2–4.9)
T3 Uptake Ratio: 27 % (ref 24–39)
T4, Total: 7.6 ug/dL (ref 4.5–12.0)
TSH: 1.24 u[IU]/mL (ref 0.450–4.500)

## 2020-09-10 ENCOUNTER — Other Ambulatory Visit: Payer: Self-pay | Admitting: Physician Assistant

## 2020-09-10 DIAGNOSIS — E559 Vitamin D deficiency, unspecified: Secondary | ICD-10-CM

## 2020-09-10 DIAGNOSIS — E1165 Type 2 diabetes mellitus with hyperglycemia: Secondary | ICD-10-CM

## 2020-09-10 MED ORDER — METFORMIN HCL 500 MG PO TABS: 500.0000 mg | ORAL_TABLET | Freq: Two times a day (BID) | ORAL | 3 refills | Status: DC

## 2020-09-10 MED ORDER — VITAMIN D (ERGOCALCIFEROL) 1.25 MG (50000 UNIT) PO CAPS: 50000.0000 [IU] | ORAL_CAPSULE | ORAL | 0 refills | Status: DC

## 2020-09-10 NOTE — Progress Notes (Signed)
Follow up call placed to patient. Pt reports ongoing psychosocial stressors. Currently she is caught up with utility bill; however, she needs additional rental assistance. Pt has applied for [facility] services through the [employer]. Pt denies any issues with [employer], noting she is not yet in need of the Eviction Mediation through [facility].   Pt agreed to follow up with LCSW at upcoming appointment. No additional concerns noted.

## 2020-09-16 NOTE — BH Specialist Note (Signed)
LCSW met with patient during visit with PA-C McClung. Pt shared that she continues to have difficulty managing depression and anxiety symptoms. Pt utilizes healthy coping skills; however, endorses continued panic attacks daily and difficulty obtaining sleep.  LCSW discussed benefits of medication management and pt agreed to begin SSRI to assist with management of symptoms.

## 2020-09-22 NOTE — Progress Notes (Unsigned)
   S:     PCP: Marylene Land  Patient arrives in good spirits. Presents to the clinic for hypertension evaluation, counseling, and management.  Patient was referred and last seen by Primary Care Provider on 09/03/20. At the visit, BP was elevated at 172/80 and home BP readings range 150-170/85-95. Pt transitioned from HCTZ to combo lisinopril-HCTZ 20-25 mg daily. Additionally, pt experienced frequent anxiety and was started on Zoloft 50 mg daily.  Today, patient reports ***  Labs 1/5 - wnl  Compliance? Took meds this morning? When do you take your meds? Dizziness, headaches, blurred vision? History of swelling? Check Clinic BP? Home BP logs? If no logs, bring to next visit w/ BP cuff Go over BP goals Additional BP therapy if needed .  Diet??  Exercise??   Medication adherence *** .  Current BP Medications include:  Lisinopril-HCTZ 20-25 mg daily  Antihypertensives tried in the past include: none  Dietary habits include: *** Exercise habits include:*** Family / Social history:  -Fhx: DM in brother -Tobacco use: current every day smoker (0.25 ppd)   O:   Home BP readings: ***  Last 3 Office BP readings: BP Readings from Last 3 Encounters:  09/03/20 (!) 172/80  05/21/20 (!) 155/73  04/23/20 (!) 144/117    BMET    Component Value Date/Time   NA 141 09/03/2020 1158   K 4.6 09/03/2020 1158   CL 99 09/03/2020 1158   CO2 23 09/03/2020 1158   GLUCOSE 94 09/03/2020 1158   GLUCOSE 129 (H) 05/21/2020 0553   BUN 10 09/03/2020 1158   CREATININE 0.69 09/03/2020 1158   CALCIUM 9.9 09/03/2020 1158   GFRNONAA 97 09/03/2020 1158   GFRAA 112 09/03/2020 1158    Renal function: CrCl cannot be calculated (Unknown ideal weight.).  Clinical ASCVD: No  The ASCVD Risk score Denman George DC Jr., et al., 2013) failed to calculate for the following reasons:   Cannot find a previous HDL lab   Cannot find a previous total cholesterol lab   A/P: Hypertension diagnosed *** currently *** on  current medications. BP Goal = < *** mmHg. Medication adherence ***.  -{Meds adjust:18428} ***.  -F/u labs ordered - BMET at next visit -Counseled on lifestyle modifications for blood pressure control including reduced dietary sodium, increased exercise, adequate sleep.  Results reviewed and written information provided.   Total time in face-to-face counseling *** minutes.   F/U Clinic Visit in ***.  Patient seen with ***  Fabio Neighbors, PharmD, BCPS PGY2 Ambulatory Care Resident [facility]  Pharmacy

## 2020-09-24 ENCOUNTER — Ambulatory Visit: Payer: Self-pay | Admitting: Licensed Clinical Social Worker

## 2020-09-24 ENCOUNTER — Ambulatory Visit: Payer: Self-pay | Admitting: Pharmacist

## 2020-10-03 ENCOUNTER — Other Ambulatory Visit: Payer: Self-pay

## 2020-10-03 ENCOUNTER — Telehealth: Payer: Self-pay | Admitting: Licensed Clinical Social Worker

## 2020-10-03 NOTE — Telephone Encounter (Signed)
Late Entry-09/24/20  Call placed to patient regarding scheduled IBH appointment. Pt shared that a loved one passed away the day before resulting in a panic attack. Prior to this, pt has been compliant with medications noting that symptoms were decreasing.   Pt is actively talking with family and trying to maintain routine of going to work to cope with grief. LCSW provided encouragement.  Per pt request, LCSW will mail out information on grief support resources, Haley Erickson, and a tracker to assist with monitoring blood pressure and/or blood sugars

## 2020-10-28 ENCOUNTER — Ambulatory Visit: Payer: Self-pay | Attending: Critical Care Medicine | Admitting: Critical Care Medicine

## 2020-10-28 ENCOUNTER — Encounter: Payer: Self-pay | Admitting: Critical Care Medicine

## 2020-10-28 ENCOUNTER — Other Ambulatory Visit: Payer: Self-pay

## 2020-10-28 DIAGNOSIS — E119 Type 2 diabetes mellitus without complications: Secondary | ICD-10-CM | POA: Insufficient documentation

## 2020-10-28 DIAGNOSIS — F411 Generalized anxiety disorder: Secondary | ICD-10-CM | POA: Insufficient documentation

## 2020-10-28 DIAGNOSIS — I10 Essential (primary) hypertension: Secondary | ICD-10-CM | POA: Insufficient documentation

## 2020-10-28 DIAGNOSIS — E1165 Type 2 diabetes mellitus with hyperglycemia: Secondary | ICD-10-CM

## 2020-10-28 NOTE — Assessment & Plan Note (Signed)
A1c initially was 6.3 now on Metformin blood glucoses are improved  Continue Metformin as prescribed

## 2020-10-28 NOTE — Progress Notes (Signed)
Subjective:    Patient ID: Haley Erickson, female    DOB: 11/05/1960, 60 y.o.   MRN: [id number] Virtual Visit via Telephone Note  I connected with Haley Erickson on 10/28/20 at 10:30 AM EST by telephone and verified that I am speaking with the correct person using two identifiers.   Consent:  I discussed the limitations, risks, security and privacy concerns of performing an evaluation and management service by telephone and the availability of in person appointments. I also discussed with the patient that there may be a patient responsible charge related to this service. The patient expressed understanding and agreed to proceed.  Location of patient: Patient at home  Location of provider: I am in the office  Persons participating in the televisit with the patient.   No one else on the call    History of Present Illness:  60 y.o.F here to est PCP  T2DM GAD  10/28/2020 This a 60 year old female seen today by way of a telephone visit to establish for primary care.  She had seen a face-to-face visit with physician assistant Sharon Seller in January.  History of severe generalized anxiety disorder, type 2 diabetes, hypertension.  Patient does now have insurance [employer].  She works in a hotel.  Note blood sugar today was 129.  She states Metformin causes some GI upset but she is getting used to this.  Patient stress levels remain high as she has had another death in her family.  She is at regular visits with our licensed clinical social worker however she does not yet have a mental health provider.  From primary care's perspective the patient is due up colon cancer screening mammogram and Pap smear.  Patient has been triple vaccinated for COVID.  Patient is taking the Zestoretic but does not monitor her blood pressure at home.  Patient states when she takes the hydroxyzine on top of the sertraline she gets somewhat brain fog.    No past medical history on file.   Family  History  Problem Relation Age of Onset  . Diabetes Brother      Social History   Socioeconomic History  . Marital status: Single    Spouse name: Not on file  . Number of children: Not on file  . Years of education: Not on file  . Highest education level: Not on file  Occupational History  . Not on file  Tobacco Use  . Smoking status: Current Some Day Smoker    Packs/day: 0.25    Types: Cigarettes  . Smokeless tobacco: Never Used  Substance and Sexual Activity  . Alcohol use: Yes    Comment: occcasional  . Drug use: No  . Sexual activity: Not on file  Other Topics Concern  . Not on file  Social History Narrative  . Not on file   Social Determinants of Health   Financial Resource Strain: Not on file  Food Insecurity: Not on file  Transportation Needs: Not on file  Physical Activity: Not on file  Stress: Not on file  Social Connections: Not on file  Intimate Partner Violence: Not on file     No Known Allergies   Outpatient Medications Prior to Visit  Medication Sig Dispense Refill  . hydrOXYzine (ATARAX/VISTARIL) 25 MG tablet Take 1 tablet (25 mg total) by mouth every 8 (eight) hours as needed for anxiety. 30 tablet 1  . lisinopril-hydrochlorothiazide (ZESTORETIC) 20-25 MG tablet Take 1 tablet by mouth daily. 90 tablet 3  .  metFORMIN (GLUCOPHAGE) 500 MG tablet Take 1 tablet (500 mg total) by mouth 2 (two) times daily with a meal. 180 tablet 3  . sertraline (ZOLOFT) 50 MG tablet Take 1 tablet (50 mg total) by mouth daily. 30 tablet 3  . Vitamin D, Ergocalciferol, (DRISDOL) 1.25 MG (50000 UNIT) CAPS capsule Take 1 capsule (50,000 Units total) by mouth every 7 (seven) days. 16 capsule 0   No facility-administered medications prior to visit.     Review of Systems Constitutional:   No  weight loss, night sweats,  Fevers, chills, fatigue, lassitude. HEENT:   No headaches,  Difficulty swallowing,  Tooth/dental problems,  Sore throat,                No sneezing,  itching, ear ache, nasal congestion, post nasal drip,   CV:  No chest pain,  Orthopnea, PND, swelling in lower extremities, anasarca, dizziness, palpitations  GI  No heartburn, indigestion, abdominal pain, nausea, vomiting, diarrhea, change in bowel habits, loss of appetite  Resp: No shortness of breath with exertion or at rest.  No excess mucus, no productive cough,  No non-productive cough,  No coughing up of blood.  No change in color of mucus.  No wheezing.  No chest wall deformity  Skin: no rash or lesions.  GU: no dysuria, change in color of urine, no urgency or frequency.  No flank pain.  MS:  No joint pain or swelling.  No decreased range of motion.  No back pain.  Psych:  No change in mood or affect. anxiety.  No memory loss.     Objective:   Physical Exam No exam this is a phone visit       Assessment & Plan:  I personally reviewed all images and lab data in the [facility] system as well as any outside material available during this office visit and agree with the  radiology impressions.   HTN (hypertension) Continue Zestoretic for now we will bring the patient in for direct office exam repeat blood pressure check  Generalized anxiety disorder Will refer patient directly to psychiatry for further evaluations  Continue sertraline for now  Type 2 diabetes mellitus (HCC) A1c initially was 6.3 now on Metformin blood glucoses are improved  Continue Metformin as prescribed   Jolin was seen today for establish care.  Diagnoses and all orders for this visit:  Primary hypertension  Generalized anxiety disorder  Type 2 diabetes mellitus with hyperglycemia, without long-term current use of insulin (HCC)   Follow Up Instructions: Patient knows a follow-up visit will be scheduled face-to-face later in this month or 1 April We will send a Cologuard test to her home for cancer screening of the colon we will set up a mid mammogram and a referral be made for Pap smear   I  discussed the assessment and treatment plan with the patient. The patient was provided an opportunity to ask questions and all were answered. The patient agreed with the plan and demonstrated an understanding of the instructions.   The patient was advised to call back or seek an in-person evaluation if the symptoms worsen or if the condition fails to improve as anticipated.  I provided 30 minutes of non-face-to-face time during this encounter  including  median intraservice time , review of notes, labs, imaging, medications  and explaining diagnosis and management to the patient .    Shan Levans, MD

## 2020-10-28 NOTE — Assessment & Plan Note (Signed)
Will refer patient directly to psychiatry for further evaluations  Continue sertraline for now

## 2020-10-28 NOTE — Assessment & Plan Note (Signed)
Continue Zestoretic for now we will bring the patient in for direct office exam repeat blood pressure check

## 2020-10-28 NOTE — Progress Notes (Signed)
-  Discuss metformin, home reading of 129 this AM

## 2020-12-08 ENCOUNTER — Ambulatory Visit: Payer: Self-pay | Admitting: Critical Care Medicine

## 2021-01-13 ENCOUNTER — Other Ambulatory Visit: Payer: Self-pay

## 2021-01-13 ENCOUNTER — Encounter (HOSPITAL_COMMUNITY): Payer: Self-pay

## 2021-01-13 ENCOUNTER — Ambulatory Visit (HOSPITAL_COMMUNITY)
Admission: EM | Admit: 2021-01-13 | Discharge: 2021-01-13 | Disposition: A | Discharge status: Home / Self Care | Payer: Self-pay | Attending: Family Medicine | Admitting: Family Medicine

## 2021-01-13 DIAGNOSIS — J3089 Other allergic rhinitis: Secondary | ICD-10-CM

## 2021-01-13 DIAGNOSIS — H6121 Impacted cerumen, right ear: Secondary | ICD-10-CM

## 2021-01-13 MED ORDER — FLUTICASONE PROPIONATE 50 MCG/ACT NA SUSP: 1.0000 | Freq: Every day | NASAL | 2 refills | Status: DC

## 2021-01-13 MED ORDER — CETIRIZINE HCL 10 MG PO TABS: 10.0000 mg | ORAL_TABLET | Freq: Every day | ORAL | 2 refills | Status: DC

## 2021-01-13 MED ORDER — PREDNISONE 20 MG PO TABS: 40.0000 mg | ORAL_TABLET | Freq: Every day | ORAL | 0 refills | Status: DC

## 2021-01-13 NOTE — ED Triage Notes (Signed)
Pt presents with itchy throat, eyes and nose & ear fullness for past few days.

## 2021-01-13 NOTE — ED Provider Notes (Signed)
[facility]    CSN: [id number] Arrival date & time: 01/13/21  0846      History   Chief Complaint Chief Complaint  Patient presents with  . Allergies    HPI Haley Erickson is a 60 y.o. female.   Patient presenting today with several day history of itchy scratchy throat, itchy eyes with clear discharge, runny nose and congestion, sinus pressure, ear fullness right worse than left.  Denies fever, chills, body aches, chest pain, shortness of breath, abdominal pain, nausea vomiting or diarrhea.  Known history of seasonal allergies has not been consistently taking allergy medication as essentially not this significant.  Did try some nasal spray once or twice since onset.    History reviewed. No pertinent past medical history.  Patient Active Problem List   Diagnosis Date Noted  . HTN (hypertension) 10/28/2020  . Generalized anxiety disorder 10/28/2020  . Type 2 diabetes mellitus (HCC) 10/28/2020    Past Surgical History:  Procedure Laterality Date  . FOOT SURGERY Right 05/13/2016  . TUBAL LIGATION      OB History   No obstetric history on file.      Home Medications    Prior to Admission medications   Medication Sig Start Date End Date Taking? Authorizing Provider  cetirizine (ZYRTEC ALLERGY) 10 MG tablet Take 1 tablet (10 mg total) by mouth daily. 01/13/21  Yes Particia Nearing, PA-C  fluticasone [facility]) 50 MCG/ACT nasal spray Place 1 spray into both nostrils daily. 01/13/21  Yes Particia Nearing, PA-C  predniSONE (DELTASONE) 20 MG tablet Take 2 tablets (40 mg total) by mouth daily with breakfast. 01/13/21  Yes Particia Nearing, PA-C  hydrOXYzine (ATARAX/VISTARIL) 25 MG tablet Take 1 tablet (25 mg total) by mouth every 8 (eight) hours as needed for anxiety. 09/03/20   Anders Simmonds, PA-C  lisinopril-hydrochlorothiazide (ZESTORETIC) 20-25 MG tablet Take 1 tablet by mouth daily. 09/03/20   Anders Simmonds, PA-C  metFORMIN (GLUCOPHAGE)  500 MG tablet Take 1 tablet (500 mg total) by mouth 2 (two) times daily with a meal. 09/10/20   McClung, Marzella Schlein, PA-C  sertraline (ZOLOFT) 50 MG tablet Take 1 tablet (50 mg total) by mouth daily. 09/03/20   Anders Simmonds, PA-C  Vitamin D, Ergocalciferol, (DRISDOL) 1.25 MG (50000 UNIT) CAPS capsule Take 1 capsule (50,000 Units total) by mouth every 7 (seven) days. 09/10/20   Anders Simmonds, PA-C    Family History Family History  Problem Relation Age of Onset  . Diabetes Brother     Social History Social History   Tobacco Use  . Smoking status: Current Some Day Smoker    Packs/day: 0.25    Types: Cigarettes  . Smokeless tobacco: Never Used  Substance Use Topics  . Alcohol use: Yes    Comment: occcasional  . Drug use: No     Allergies   Patient has no known allergies.   Review of Systems Review of Systems Per HPI  Physical Exam Triage Vital Signs ED Triage Vitals  Enc Vitals Group     BP 01/13/21 1100 131/67     Pulse Rate 01/13/21 1100 66     Resp 01/13/21 1100 17     Temp 01/13/21 1100 (!) 97.5 F (36.4 C)     Temp Source 01/13/21 1100 Oral     SpO2 01/13/21 1100 94 %     Weight --      Height --      Head Circumference --  Peak Flow --      Pain Score 01/13/21 1102 0     Pain Loc --      Pain Edu? --      Excl. in GC? --    No data found.  Updated Vital Signs BP 131/67 (BP Location: Right Arm)   Pulse 66   Temp (!) 97.5 F (36.4 C) (Oral)   Resp 17   LMP 05/22/2013   SpO2 94%   Visual Acuity Right Eye Distance:   Left Eye Distance:   Bilateral Distance:    Right Eye Near:   Left Eye Near:    Bilateral Near:     Physical Exam Vitals and nursing note reviewed.  Constitutional:      Appearance: Normal appearance. She is not ill-appearing.  HENT:     Head: Atraumatic.     Right Ear: There is impacted cerumen.     Left Ear: Tympanic membrane normal.     Nose: Rhinorrhea present.     Mouth/Throat:     Mouth: Mucous membranes are  moist.     Pharynx: Oropharynx is clear. Posterior oropharyngeal erythema present.  Eyes:     Extraocular Movements: Extraocular movements intact.     Conjunctiva/sclera: Conjunctivae normal.  Cardiovascular:     Rate and Rhythm: Normal rate and regular rhythm.     Heart sounds: Normal heart sounds.  Pulmonary:     Effort: Pulmonary effort is normal.     Breath sounds: Normal breath sounds. No wheezing or rales.  Abdominal:     General: Bowel sounds are normal. There is no distension.     Palpations: Abdomen is soft.     Tenderness: There is no abdominal tenderness. There is no guarding.  Musculoskeletal:        General: Normal range of motion.     Cervical back: Normal range of motion and neck supple.  Skin:    General: Skin is warm and dry.  Neurological:     Mental Status: She is alert and oriented to person, place, and time.  Psychiatric:        Mood and Affect: Mood normal.        Thought Content: Thought content normal.        Judgment: Judgment normal.      UC Treatments / Results  Labs (all labs ordered are listed, but only abnormal results are displayed) Labs Reviewed - No data to display  EKG   Radiology No results found.  Procedures Procedures (including critical care time)  Medications Ordered in UC Medications - No data to display  Initial Impression / Assessment and Plan / UC Course  I have reviewed the triage vital signs and the nursing notes.  Pertinent labs & imaging results that were available during my care of the patient were reviewed by me and considered in my medical decision making (see chart for details).     Suspect seasonal allergies to be the cause of her current symptoms, will start short burst of prednisone to get symptoms under control and start consistent allergy regimen with Zyrtec and Flonase.  Discussed symptomatic over-the-counter supportive measures as well.  She also was found to have a cerumen impaction of the right side,  lavage performed with excellent benefit and no complications.  TM visualized and benign post procedure.  Follow-up if symptoms not fully resolving.  Final Clinical Impressions(s) / UC Diagnoses   Final diagnoses:  Seasonal allergic rhinitis due to other allergic trigger  Impacted cerumen  of right ear   Discharge Instructions   None    ED Prescriptions    Medication Sig Dispense Auth. Provider   predniSONE (DELTASONE) 20 MG tablet Take 2 tablets (40 mg total) by mouth daily with breakfast. 6 tablet Particia Nearing, PA-C   fluticasone [facility]) 50 MCG/ACT nasal spray Place 1 spray into both nostrils daily. 16 g Particia Nearing, [place]   cetirizine (ZYRTEC ALLERGY) 10 MG tablet Take 1 tablet (10 mg total) by mouth daily. 30 tablet Particia Nearing, [place]     PDMP not reviewed this encounter.   Particia Nearing, [place] 01/13/21 1243

## 2021-09-22 ENCOUNTER — Telehealth: Payer: Self-pay | Admitting: *Deleted

## 2021-09-22 ENCOUNTER — Other Ambulatory Visit: Payer: Self-pay | Admitting: Physician Assistant

## 2021-09-22 DIAGNOSIS — I10 Essential (primary) hypertension: Secondary | ICD-10-CM

## 2021-09-22 NOTE — Telephone Encounter (Signed)
Called pt and set up an appt with Geryl Rankins in March for her check up and medication refills.

## 2021-09-22 NOTE — Telephone Encounter (Signed)
Requested medication (s) are due for refill today:   Yes  Requested medication (s) are on the active medication list:   Yes  Future visit scheduled:   Yes I called and got her scheduled with Zelda for March.   Last ordered: 09/03/2020 #90, 3 refills  Returned because protocol failed due to lab work being due.   Provider to review for refills prior to her appt in March.   Requested Prescriptions  Pending Prescriptions Disp Refills   lisinopril-hydrochlorothiazide (ZESTORETIC) 20-25 MG tablet [Pharmacy Med Name: LISINOPRIL-HCTZ 20/25MG  TABLETS] 90 tablet 3    Sig: Take 1 tablet by mouth daily.     Cardiovascular:  ACEI + Diuretic Combos Failed - 09/22/2021  9:26 AM      Failed - Na in normal range and within 180 days    Sodium  Date Value Ref Range Status  09/03/2020 141 134 - 144 mmol/L Final          Failed - K in normal range and within 180 days    Potassium  Date Value Ref Range Status  09/03/2020 4.6 3.5 - 5.2 mmol/L Final          Failed - Cr in normal range and within 180 days    Creatinine, Ser  Date Value Ref Range Status  09/03/2020 0.69 0.57 - 1.00 mg/dL Final          Failed - Ca in normal range and within 180 days    Calcium  Date Value Ref Range Status  09/03/2020 9.9 8.7 - 10.2 mg/dL Final   Calcium, Ion  Date Value Ref Range Status  01/02/2012 1.16 1.12 - 1.32 mmol/L Final          Failed - Valid encounter within last 6 months    Recent Outpatient Visits           10 months ago Primary hypertension   [facility] Storm Frisk, MD   1 year ago Hypertension, unspecified type   [facility] [place], Marzella Schlein, [place]   1 year ago Financial difficulties   [facility] [employer], [place] D, LCSW   1 year ago Anxiety   [facility] [address] [facility] [place], [place], [place]   5 years ago Acute upper respiratory infection   [facility] [place],  Eilleen Kempf, MD       Future Appointments             In 1 month Claiborne Rigg, NP [facility]            Passed - Patient is not pregnant      Passed - Last BP in normal range    BP Readings from Last 1 Encounters:  01/13/21 131/67

## 2021-11-18 ENCOUNTER — Ambulatory Visit: Payer: Self-pay | Admitting: Nurse Practitioner

## 2021-11-23 ENCOUNTER — Ambulatory Visit: Payer: Self-pay | Admitting: Nurse Practitioner

## 2021-12-10 ENCOUNTER — Ambulatory Visit: Payer: Self-pay | Admitting: Physician Assistant

## 2022-01-08 ENCOUNTER — Ambulatory Visit
Admission: EM | Admit: 2022-01-08 | Discharge: 2022-01-08 | Disposition: A | Discharge status: Home / Self Care | Payer: 59 | Attending: Family Medicine | Admitting: Family Medicine

## 2022-01-08 DIAGNOSIS — J301 Allergic rhinitis due to pollen: Secondary | ICD-10-CM

## 2022-01-08 DIAGNOSIS — J029 Acute pharyngitis, unspecified: Secondary | ICD-10-CM | POA: Diagnosis present

## 2022-01-08 LAB — POCT RAPID STREP A (OFFICE): Rapid Strep A Screen: NEGATIVE

## 2022-01-08 MED ORDER — FLUTICASONE PROPIONATE 50 MCG/ACT NA SUSP: 2.0000 | Freq: Every day | NASAL | 2 refills | Status: AC

## 2022-01-08 MED ORDER — TRIAMCINOLONE ACETONIDE 40 MG/ML IJ SUSP
40.0000 mg | Freq: Once | INTRAMUSCULAR | Status: AC
Start: 2022-01-08 — End: 2022-01-08
  Administered 2022-01-08: 40 mg via INTRAMUSCULAR

## 2022-01-08 NOTE — ED Triage Notes (Signed)
Pt c/o sore throat, cough, nasal drainage, pruritic and watery eyes, sinus ache ? ?Denies earache, headache, nausea, vomiting,  ? ?Onset ~ Wednesday. Tried Claritin and mucinex otc w/out relief.  ? ?Takes hydroxyzine prn anxiety.  ?

## 2022-01-08 NOTE — ED Provider Notes (Signed)
?[facility] ? ? ? ?CSN: [id number] ?Arrival date & time: 01/08/22  0808 ? ? ?  ? ?History   ?Chief Complaint ?Chief Complaint  ?Patient presents with  ? Cough  ? Sore Throat  ? ? ?HPI ?Haley Erickson is a 61 y.o. female.  ? ? ?Cough ?Sore Throat ? ?Here for sore throat that began May 10. At first she had a good bit of itching in her throat too. Then has developed postnasal dc and now some sinus pressure. No fever/chills/malaise/aches. Did have a COVID test and was neg at work.  ? ?Does have DM, sugars have been good. ?History reviewed. No pertinent past medical history. ? ?Patient Active Problem List  ? Diagnosis Date Noted  ? HTN (hypertension) 10/28/2020  ? Generalized anxiety disorder 10/28/2020  ? Type 2 diabetes mellitus (HCC) 10/28/2020  ? ? ?Past Surgical History:  ?Procedure Laterality Date  ? FOOT SURGERY Right 05/13/2016  ? TUBAL LIGATION    ? ? ?OB History   ?No obstetric history on file. ?  ? ? ? ?Home Medications   ? ?Prior to Admission medications   ?Medication Sig Start Date End Date Taking? Authorizing Provider  ?fluticasone (FLONASE) 50 MCG/ACT nasal spray Place 2 sprays into both nostrils daily. 01/08/22   Zenia Resides, MD  ?hydrOXYzine (ATARAX/VISTARIL) 25 MG tablet Take 1 tablet (25 mg total) by mouth every 8 (eight) hours as needed for anxiety. 09/03/20   Anders Simmonds, PA-C  ?lisinopril-hydrochlorothiazide (ZESTORETIC) 20-25 MG tablet TAKE 1 TABLET BY MOUTH DAILY 09/24/21   Storm Frisk, MD  ?metFORMIN (GLUCOPHAGE) 500 MG tablet Take 1 tablet (500 mg total) by mouth 2 (two) times daily with a meal. 09/10/20   Anders Simmonds, PA-C  ? ? ?Family History ?Family History  ?Problem Relation Age of Onset  ? Diabetes Brother   ? ? ?Social History ?Social History  ? ?Tobacco Use  ? Smoking status: Some Days  ?  Packs/day: 0.25  ?  Types: Cigarettes  ? Smokeless tobacco: Never  ?Substance Use Topics  ? Alcohol use: Yes  ?  Comment: occcasional  ? Drug use: No   ? ? ? ?Allergies   ?Patient has no known allergies. ? ? ?Review of Systems ?Review of Systems  ?Respiratory:  Positive for cough.   ? ? ?Physical Exam ?Triage Vital Signs ?ED Triage Vitals  ?Enc Vitals Group  ?   BP 01/08/22 0822 137/80  ?   Pulse Rate 01/08/22 0822 75  ?   Resp 01/08/22 0822 14  ?   Temp 01/08/22 0822 98.3 ?F (36.8 ?C)  ?   Temp Source 01/08/22 0822 Oral  ?   SpO2 01/08/22 0822 98 %  ?   Weight --   ?   Height --   ?   Head Circumference --   ?   Peak Flow --   ?   Pain Score 01/08/22 0824 0  ?   Pain Loc --   ?   Pain Edu? --   ?   Excl. in GC? --   ? ?No data found. ? ?Updated Vital Signs ?BP 137/80 (BP Location: Left Arm)   Pulse 75   Temp 98.3 ?F (36.8 ?C) (Oral)   Resp 14   LMP 05/22/2013   SpO2 98%  ? ?Visual Acuity ?Right Eye Distance:   ?Left Eye Distance:   ?Bilateral Distance:   ? ?Right Eye Near:   ?Left Eye Near:    ?  Bilateral Near:    ? ?Physical Exam ?Vitals reviewed.  ?Constitutional:   ?   General: She is not in acute distress. ?   Appearance: She is not toxic-appearing.  ?HENT:  ?   Right Ear: Tympanic membrane and ear canal normal.  ?   Left Ear: Tympanic membrane and ear canal normal.  ?   Nose: Congestion present.  ?   Mouth/Throat:  ?   Mouth: Mucous membranes are moist.  ?   Comments: She has tonsillar hypertrophy 2+, with clear mucus in OP. Also has some white exudate on the right tonsil. ?Eyes:  ?   Extraocular Movements: Extraocular movements intact.  ?   Conjunctiva/sclera: Conjunctivae normal.  ?   Pupils: Pupils are equal, round, and reactive to light.  ?Cardiovascular:  ?   Rate and Rhythm: Normal rate and regular rhythm.  ?   Heart sounds: No murmur heard. ?Pulmonary:  ?   Effort: Pulmonary effort is normal. No respiratory distress.  ?   Breath sounds: No wheezing, rhonchi or rales.  ?Chest:  ?   Chest wall: No tenderness.  ?Musculoskeletal:  ?   Cervical back: Neck supple.  ?Lymphadenopathy:  ?   Cervical: No cervical adenopathy.  ?Skin: ?   Capillary Refill:  Capillary refill takes less than 2 seconds.  ?   Coloration: Skin is not jaundiced or pale.  ?Neurological:  ?   General: No focal deficit present.  ?   Mental Status: She is alert and oriented to person, place, and time.  ?Psychiatric:     ?   Behavior: Behavior normal.  ? ? ? ?UC Treatments / Results  ?Labs ?(all labs ordered are listed, but only abnormal results are displayed) ?Labs Reviewed  ?CULTURE, GROUP A STREP [facility])  ?POCT RAPID STREP A (OFFICE)  ? ? ?EKG ? ? ?Radiology ?No results found. ? ?Procedures ?Procedures (including critical care time) ? ?Medications Ordered in UC ?Medications  ?triamcinolone acetonide (KENALOG-40) injection 40 mg (has no administration in time range)  ? ? ?Initial Impression / Assessment and Plan / UC Course  ?I have reviewed the triage vital signs and the nursing notes. ? ?Pertinent labs & imaging results that were available during my care of the patient were reviewed by me and considered in my medical decision making (see chart for details). ? ?  ? ?Strep is negative; throat culture is sent.  We will do a shot of triamcinolone today to help with her allergies and send in some Flonase ?Final Clinical Impressions(s) / UC Diagnoses  ? ?Final diagnoses:  ?Sore throat  ?Seasonal allergic rhinitis due to pollen  ? ? ? ?Discharge Instructions   ? ?  ?Your strep test was negative; throat culture is sent, staff will call you if it is positive. ? ?Given a shot of triamcinolone 40 mg today.  This can raise your sugars.  Continues to be really good with her diet for the next few days and drink plenty of fluids ? ?Flonase nasal spray--2 sprays each nostril once daily; can take a week or so for this to start helping allergies. ? ? ? ? ?ED Prescriptions   ? ? Medication Sig Dispense Auth. Provider  ? fluticasone (FLONASE) 50 MCG/ACT nasal spray Place 2 sprays into both nostrils daily. 16 g Zenia Resides, MD  ? ?  ? ?PDMP not reviewed this encounter. ?  ?Zenia Resides, MD ?01/08/22  [phone] ? ?

## 2022-01-08 NOTE — Discharge Instructions (Addendum)
Your strep test was negative; throat culture is sent, staff will call you if it is positive. ? ?Given a shot of triamcinolone 40 mg today.  This can raise your sugars.  Continues to be really good with her diet for the next few days and drink plenty of fluids ? ?Flonase nasal spray--2 sprays each nostril once daily; can take a week or so for this to start helping allergies. ?

## 2022-01-11 LAB — CULTURE, GROUP A STREP (THRC)

## 2022-01-13 ENCOUNTER — Ambulatory Visit: Payer: Self-pay | Admitting: Physician Assistant

## 2022-01-13 NOTE — Progress Notes (Deleted)
Patient ID: Haley Erickson, female   DOB: Oct 03, 1960, 61 y.o.   MRN: [id number]   Seen at [facility] 01/08/2022 for ST and given triamcinilone injection and flonase.  Strep test and culture were neg.

## 2022-07-14 ENCOUNTER — Other Ambulatory Visit: Payer: Self-pay | Admitting: Physician Assistant

## 2022-07-14 ENCOUNTER — Other Ambulatory Visit: Payer: Self-pay | Admitting: Critical Care Medicine

## 2022-07-14 DIAGNOSIS — I10 Essential (primary) hypertension: Secondary | ICD-10-CM

## 2022-07-14 DIAGNOSIS — E1165 Type 2 diabetes mellitus with hyperglycemia: Secondary | ICD-10-CM

## 2022-07-14 NOTE — Telephone Encounter (Signed)
Requested medication (s) are due for refill today: yes  Requested medication (s) are on the active medication list: yes  Last refill:  09/10/20  Future visit scheduled:yes  Notes to clinic:  Unable to refill per protocol, courtesy refill already given, routing for provider approval. Patient has future OV scheduled, routing for review.     Requested Prescriptions  Pending Prescriptions Disp Refills   metFORMIN (GLUCOPHAGE) 500 MG tablet 180 tablet 3    Sig: Take 1 tablet (500 mg total) by mouth 2 (two) times daily with a meal.     Endocrinology:  Diabetes - Biguanides Failed - 07/14/2022  2:18 PM      Failed - Cr in normal range and within 360 days    Creatinine, Ser  Date Value Ref Range Status  09/03/2020 0.69 0.57 - 1.00 mg/dL Final         Failed - HBA1C is between 0 and 7.9 and within 180 days    Hgb A1c MFr Bld  Date Value Ref Range Status  09/03/2020 6.3 (H) 4.8 - 5.6 % Final    Comment:             Prediabetes: 5.7 - 6.4          Diabetes: >6.4          Glycemic control for adults with diabetes: <7.0          Failed - eGFR in normal range and within 360 days    GFR calc Af Amer  Date Value Ref Range Status  09/03/2020 112 >59 mL/min/1.73 Final    Comment:    **In accordance with recommendations from the NKF-ASN Task force,**   [facility] is in the process of updating its eGFR calculation to the   2021 CKD-EPI creatinine equation that estimates kidney function   without a race variable.    GFR calc non Af Amer  Date Value Ref Range Status  09/03/2020 97 >59 mL/min/1.73 Final         Failed - B12 Level in normal range and within 720 days    No results found for: "VITAMINB12"       Failed - Valid encounter within last 6 months    Recent Outpatient Visits           1 year ago Primary hypertension   [place], Patrick E, MD   1 year ago Hypertension, unspecified type   [place],  Dionne Bucy, [place]   1 year ago Financial difficulties   [place], [place], LCSW   2 years ago [place], [place], [place]   5 years ago Acute upper respiratory infection   [facility] at [facility], Ines Bloomer, MD       Future Appointments             In 2 months Elsie Stain, MD [place]            Failed - CBC within normal limits and completed in the last 12 months    WBC  Date Value Ref Range Status  05/21/2020 8.2 4.0 - 10.5 K/uL Final   RBC  Date Value Ref Range Status  05/21/2020 5.03 3.87 - 5.11 MIL/uL Final   Hemoglobin  Date Value Ref Range Status  05/21/2020 15.2 (H) 12.0 - 15.0 g/dL Final  HCT  Date Value Ref Range Status  05/21/2020 47.0 (H) 36.0 - 46.0 % Final   MCHC  Date Value Ref Range Status  05/21/2020 32.3 30.0 - 36.0 g/dL Final   [facility]  Date Value Ref Range Status  05/21/2020 30.2 26.0 - 34.0 pg Final   MCV  Date Value Ref Range Status  05/21/2020 93.4 80.0 - 100.0 fL Final  09/25/2013 84.4 80 - 97 fL Final   Platelet Count, POC  Date Value Ref Range Status  09/25/2013 330 142 - 424 K/uL Final   RDW  Date Value Ref Range Status  05/21/2020 13.3 11.5 - 15.5 % Final   RDW, POC  Date Value Ref Range Status  09/25/2013 17.4 % Final          lisinopril-hydrochlorothiazide (ZESTORETIC) 20-25 MG tablet 90 tablet 3    Sig: Take 1 tablet by mouth daily.     Cardiovascular:  ACEI + Diuretic Combos Failed - 07/14/2022  2:18 PM      Failed - Na in normal range and within 180 days    Sodium  Date Value Ref Range Status  09/03/2020 141 134 - 144 mmol/L Final         Failed - K in normal range and within 180 days    Potassium  Date Value Ref Range Status  09/03/2020 4.6 3.5 - 5.2 mmol/L Final         Failed - Cr in normal range and within 180 days    Creatinine, Ser  Date Value Ref Range Status   09/03/2020 0.69 0.57 - 1.00 mg/dL Final         Failed - eGFR is 30 or above and within 180 days    GFR calc Af Amer  Date Value Ref Range Status  09/03/2020 112 >59 mL/min/1.73 Final    Comment:    **In accordance with recommendations from the NKF-ASN Task force,**   [facility] is in the process of updating its eGFR calculation to the   2021 CKD-EPI creatinine equation that estimates kidney function   without a race variable.    GFR calc non Af Amer  Date Value Ref Range Status  09/03/2020 97 >59 mL/min/1.73 Final         Failed - Valid encounter within last 6 months    Recent Outpatient Visits           1 year ago Primary hypertension   [place], Patrick E, MD   1 year ago Hypertension, unspecified type   [place], Dionne Bucy, [place]   1 year ago Financial difficulties   [place], [place] D, LCSW   2 years ago [place], [place]   5 years ago Acute upper respiratory infection   [facility] [place], Ines Bloomer, MD       Future Appointments             In 2 months Elsie Stain, MD [place] - Patient is not pregnant      Passed - Last BP in normal range    BP Readings from Last 1 Encounters:  01/08/22 137/80

## 2022-07-14 NOTE — Telephone Encounter (Signed)
Medication Refill - Medication: lisinopril-hydrochlorothiazide (ZESTORETIC) 20-25 MG tablet   metFORMIN (GLUCOPHAGE) 500 MG tablet   Has the patient contacted their pharmacy? No. 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  Pt has made appt w/ Dr Delford Field, his first avaiable, for Jan 23. Pt hopes to get enough (90 days) of her medication to get her to her appt then.  Pt aware she needs to keep this appt for further refills.  Preferred Pharmacy (with phone number or street name): [facility] [phone] - [place] - [address] AT [facility] OF [address]   Has the patient been seen for an appointment in the last year OR does the patient have an upcoming appointment? Yes.    Agent: Please be advised that RX refills may take up to 3 business days. We ask that you follow-up with your pharmacy.

## 2022-07-31 ENCOUNTER — Other Ambulatory Visit: Payer: Self-pay | Admitting: Physician Assistant

## 2022-07-31 DIAGNOSIS — E1165 Type 2 diabetes mellitus with hyperglycemia: Secondary | ICD-10-CM

## 2022-08-02 ENCOUNTER — Telehealth: Payer: Self-pay | Admitting: Critical Care Medicine

## 2022-08-02 DIAGNOSIS — E1165 Type 2 diabetes mellitus with hyperglycemia: Secondary | ICD-10-CM

## 2022-08-02 NOTE — Telephone Encounter (Signed)
Requested medication (s) are due for refill today: yes  Requested medication (s) are on the active medication list: yes  Last refill:  09/10/20 #180/3  Future visit scheduled: yes  Notes to clinic:  pt is due for updated labs and OV. Has appt scheduled. Please advise for refill prior to appt.     Requested Prescriptions  Pending Prescriptions Disp Refills   metFORMIN (GLUCOPHAGE) 500 MG tablet [Pharmacy Med Name: METFORMIN 500MG TABLETS] 180 tablet 3    Sig: TAKE 1 TABLET BY MOUTH TWICE DAILY WITH A MEAL     Endocrinology:  Diabetes - Biguanides Failed - 07/31/2022 10:29 AM      Failed - Cr in normal range and within 360 days    Creatinine, Ser  Date Value Ref Range Status  09/03/2020 0.69 0.57 - 1.00 mg/dL Final         Failed - HBA1C is between 0 and 7.9 and within 180 days    Hgb A1c MFr Bld  Date Value Ref Range Status  09/03/2020 6.3 (H) 4.8 - 5.6 % Final    Comment:             Prediabetes: 5.7 - 6.4          Diabetes: >6.4          Glycemic control for adults with diabetes: <7.0          Failed - eGFR in normal range and within 360 days    GFR calc Af Amer  Date Value Ref Range Status  09/03/2020 112 >59 mL/min/1.73 Final    Comment:    **In accordance with recommendations from the NKF-ASN Task force,**   [facility] is in the process of updating its eGFR calculation to the   2021 CKD-EPI creatinine equation that estimates kidney function   without a race variable.    GFR calc non Af Amer  Date Value Ref Range Status  09/03/2020 97 >59 mL/min/1.73 Final         Failed - B12 Level in normal range and within 720 days    No results found for: "VITAMINB12"       Failed - Valid encounter within last 6 months    Recent Outpatient Visits           1 year ago Primary hypertension   [place], Patrick E, MD   1 year ago Hypertension, unspecified type   [place], Dionne Bucy, [place]   1  year ago Financial difficulties   [place], [place], LCSW   2 years ago [place], [place], [place]   5 years ago Acute upper respiratory infection   [facility] at [facility], Ines Bloomer, MD       Future Appointments             In 1 month Elsie Stain, MD [place]            Failed - CBC within normal limits and completed in the last 12 months    WBC  Date Value Ref Range Status  05/21/2020 8.2 4.0 - 10.5 K/uL Final   RBC  Date Value Ref Range Status  05/21/2020 5.03 3.87 - 5.11 MIL/uL Final   Hemoglobin  Date Value Ref Range Status  05/21/2020 15.2 (H) 12.0 - 15.0 g/dL Final   HCT  Date Value Ref Range Status  05/21/2020 47.0 (H) 36.0 - 46.0 % Final   MCHC  Date Value Ref Range Status  05/21/2020 32.3 30.0 - 36.0 g/dL Final   [facility]  Date Value Ref Range Status  05/21/2020 30.2 26.0 - 34.0 pg Final   MCV  Date Value Ref Range Status  05/21/2020 93.4 80.0 - 100.0 fL Final  09/25/2013 84.4 80 - 97 fL Final   Platelet Count, POC  Date Value Ref Range Status  09/25/2013 330 142 - 424 K/uL Final   RDW  Date Value Ref Range Status  05/21/2020 13.3 11.5 - 15.5 % Final   RDW, POC  Date Value Ref Range Status  09/25/2013 17.4 % Final

## 2022-08-02 NOTE — Telephone Encounter (Addendum)
This is a 2nd request, already routed on 08/02/22 to provider for refill request o 07/31/22 encounter.

## 2022-08-02 NOTE — Telephone Encounter (Signed)
Patient's last labs were done in 08/2020. Will have to defer refill approval to pt's PCP.

## 2022-08-02 NOTE — Telephone Encounter (Signed)
Copied from CRM [id number]. Topic: General - Other >> Aug 02, 2022 11:31 AM Everette C wrote: Reason for CRM: Medication Refill - Medication: metFORMIN (GLUCOPHAGE) 500 MG tablet [[id number]]  Has the patient contacted their pharmacy? Yes.   (Agent: If no, request that the patient contact the pharmacy for the refill. If patient does not wish to contact the pharmacy document the reason why and proceed with request.) (Agent: If yes, when and what did the pharmacy advise?)  Preferred Pharmacy (with phone number or street name): [facility] [phone] - [place], [place] - [address] AT [facility] OF [address] & [address] [place] [facility] [address] Phone: [phone] Fax: [phone] Hours: Not open 24 hours   Has the patient been seen for an appointment in the last year OR does the patient have an upcoming appointment? Yes.    Agent: Please be advised that RX refills may take up to 3 business days. We ask that you follow-up with your pharmacy.

## 2022-09-19 NOTE — Progress Notes (Deleted)
Subjective:    Patient ID: Haley Erickson, female    DOB: 02-18-1961, 61 y.o.   MRN: [id number]  History of Present Illness:  62 y.o.F here to est PCP  T2DM GAD  10/28/2020 This a 62 year old female seen today by way of a telephone visit to establish for primary care.  She had seen a face-to-face visit with physician assistant Thereasa Solo in January.  History of severe generalized anxiety disorder, type 2 diabetes, hypertension.  Patient does now have insurance [employer].  She works in a hotel.  Note blood sugar today was 129.  She states Metformin causes some GI upset but she is getting used to this.  Patient stress levels remain high as she has had another death in her family.  She is at regular visits with our licensed clinical social worker however she does not yet have a mental health provider.  From primary care's perspective the patient is due up colon cancer screening mammogram and Pap smear.  Patient has been triple vaccinated for COVID.  Patient is taking the Zestoretic but does not monitor her blood pressure at home.  Patient states when she takes the hydroxyzine on top of the sertraline she gets somewhat brain fog.  09/21/22 Not seen since 10/2020   No past medical history on file.   Family History  Problem Relation Age of Onset  . Diabetes Brother      Social History   Socioeconomic History  . Marital status: Single    Spouse name: Not on file  . Number of children: Not on file  . Years of education: Not on file  . Highest education level: Not on file  Occupational History  . Not on file  Tobacco Use  . Smoking status: Some Days    Packs/day: 0.25    Types: Cigarettes  . Smokeless tobacco: Never  Substance and Sexual Activity  . Alcohol use: Yes    Comment: occcasional  . Drug use: No  . Sexual activity: Not on file  Other Topics Concern  . Not on file  Social History Narrative  . Not on file   Social Determinants of Health   Financial  Resource Strain: Not on file  Food Insecurity: Not on file  Transportation Needs: Not on file  Physical Activity: Not on file  Stress: Not on file  Social Connections: Not on file  Intimate Partner Violence: Not on file     No Known Allergies   Outpatient Medications Prior to Visit  Medication Sig Dispense Refill  . fluticasone (FLONASE) 50 MCG/ACT nasal spray Place 2 sprays into both nostrils daily. 16 g 2  . hydrOXYzine (ATARAX/VISTARIL) 25 MG tablet Take 1 tablet (25 mg total) by mouth every 8 (eight) hours as needed for anxiety. 30 tablet 1  . lisinopril-hydrochlorothiazide (ZESTORETIC) 20-25 MG tablet TAKE 1 TABLET BY MOUTH DAILY 90 tablet 3  . metFORMIN (GLUCOPHAGE) 500 MG tablet TAKE 1 TABLET BY MOUTH TWICE DAILY WITH A MEAL 180 tablet 3   No facility-administered medications prior to visit.     Review of Systems Constitutional:   No  weight loss, night sweats,  Fevers, chills, fatigue, lassitude. HEENT:   No headaches,  Difficulty swallowing,  Tooth/dental problems,  Sore throat,                No sneezing, itching, ear ache, nasal congestion, post nasal drip,   CV:  No chest pain,  Orthopnea, PND, swelling in lower extremities, anasarca, dizziness,  palpitations  GI  No heartburn, indigestion, abdominal pain, nausea, vomiting, diarrhea, change in bowel habits, loss of appetite  Resp: No shortness of breath with exertion or at rest.  No excess mucus, no productive cough,  No non-productive cough,  No coughing up of blood.  No change in color of mucus.  No wheezing.  No chest wall deformity  Skin: no rash or lesions.  GU: no dysuria, change in color of urine, no urgency or frequency.  No flank pain.  MS:  No joint pain or swelling.  No decreased range of motion.  No back pain.  Psych:  No change in mood or affect. anxiety.  No memory loss.     Objective:   Physical Exam No exam this is a phone visit       Assessment & Plan:  I personally reviewed all images and  lab data in the [facility] system as well as any outside material available during this office visit and agree with the  radiology impressions.   No problem-specific Assessment & Plan notes found for this encounter.   There are no diagnoses linked to this encounter.  Follow Up Instructions: Patient knows a follow-up visit will be scheduled face-to-face later in this month or 1 April We will send a Cologuard test to her home for cancer screening of the colon we will set up a mid mammogram and a referral be made for Pap smear   I discussed the assessment and treatment plan with the patient. The patient was provided an opportunity to ask questions and all were answered. The patient agreed with the plan and demonstrated an understanding of the instructions.   The patient was advised to call back or seek an in-person evaluation if the symptoms worsen or if the condition fails to improve as anticipated.  I provided 30 minutes of non-face-to-face time during this encounter  including  median intraservice time , review of notes, labs, imaging, medications  and explaining diagnosis and management to the patient .    Asencion Noble, MD

## 2022-09-21 ENCOUNTER — Ambulatory Visit: Payer: 59 | Admitting: Critical Care Medicine

## 2022-10-06 ENCOUNTER — Other Ambulatory Visit: Payer: Self-pay | Admitting: Critical Care Medicine

## 2022-10-06 DIAGNOSIS — I10 Essential (primary) hypertension: Secondary | ICD-10-CM

## 2023-10-01 ENCOUNTER — Encounter: Payer: Self-pay | Admitting: Emergency Medicine

## 2023-10-01 ENCOUNTER — Ambulatory Visit
Admission: EM | Admit: 2023-10-01 | Discharge: 2023-10-01 | Disposition: A | Discharge status: Home / Self Care | Payer: 59 | Attending: Physician Assistant | Admitting: Physician Assistant

## 2023-10-01 ENCOUNTER — Other Ambulatory Visit: Payer: Self-pay

## 2023-10-01 DIAGNOSIS — J029 Acute pharyngitis, unspecified: Secondary | ICD-10-CM | POA: Diagnosis present

## 2023-10-01 DIAGNOSIS — J069 Acute upper respiratory infection, unspecified: Secondary | ICD-10-CM | POA: Diagnosis not present

## 2023-10-01 LAB — POCT RAPID STREP A (OFFICE): Rapid Strep A Screen: NEGATIVE

## 2023-10-01 MED ORDER — PROMETHAZINE-DM 6.25-15 MG/5ML PO SYRP: 5.0000 mL | ORAL_SOLUTION | Freq: Two times a day (BID) | ORAL | 0 refills | Status: DC | PRN

## 2023-10-01 NOTE — ED Triage Notes (Signed)
Pt here for cough and congestion with right ear pain and fullness x 3 days

## 2023-10-01 NOTE — ED Provider Notes (Signed)
[facility]    CSN: [id number] Arrival date & time: 10/01/23  1349      History   Chief Complaint Chief Complaint  Patient presents with   Cough    HPI Haley Erickson is a 63 y.o. female.   Patient presents today with a 3-day history of congestion, sore throat, otalgia.  She reports that pain is rated 8 on a 0-10 pain scale, described as sharp, no aggravating relieving factors notified.  She is able to swallow but does feel that it is more painful on the right side of her throat.  She has been taking Tylenol without improvement of symptoms.  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  She does have a history of seasonal allergies but reports that she is been taking her medication as prescribed.  She denies any recent antibiotics or steroids.  Denies any recent swimming or airplane travel.    History reviewed. No pertinent past medical history.  Patient Active Problem List   Diagnosis Date Noted   HTN (hypertension) 10/28/2020   Generalized anxiety disorder 10/28/2020   Type 2 diabetes mellitus (HCC) 10/28/2020    Past Surgical History:  Procedure Laterality Date   FOOT SURGERY Right 05/13/2016   TUBAL LIGATION      OB History   No obstetric history on file.      Home Medications    Prior to Admission medications   Medication Sig Start Date End Date Taking? Authorizing Provider  promethazine-dextromethorphan (PROMETHAZINE-DM) 6.25-15 MG/5ML syrup Take 5 mLs by mouth 2 (two) times daily as needed for cough. 10/01/23  Yes Jerolene Kupfer K, PA-C  fluticasone (FLONASE) 50 MCG/ACT nasal spray Place 2 sprays into both nostrils daily. 01/08/22   Zenia Resides, MD  hydrOXYzine (ATARAX/VISTARIL) 25 MG tablet Take 1 tablet (25 mg total) by mouth every 8 (eight) hours as needed for anxiety. 09/03/20   Anders Simmonds, PA-C  lisinopril-hydrochlorothiazide (ZESTORETIC) 20-25 MG tablet TAKE 1 TABLET BY MOUTH DAILY  09/24/21   Storm Frisk, MD  metFORMIN (GLUCOPHAGE) 500 MG tablet TAKE 1 TABLET BY MOUTH TWICE DAILY WITH A MEAL 08/03/22   Storm Frisk, MD    Family History Family History  Problem Relation Age of Onset   Diabetes Brother     Social History Social History   Tobacco Use   Smoking status: Some Days    Current packs/day: 0.25    Types: Cigarettes   Smokeless tobacco: Never  Substance Use Topics   Alcohol use: Yes    Comment: occcasional   Drug use: No     Allergies   Patient has no known allergies.   Review of Systems Review of Systems  Constitutional:  Positive for activity change. Negative for appetite change, fatigue and fever.  HENT:  Positive for congestion, ear pain and sore throat. Negative for sinus pressure and sneezing.   Respiratory:  Negative for cough and shortness of breath.   Cardiovascular:  Negative for chest pain.  Gastrointestinal:  Negative for abdominal pain, diarrhea, nausea and vomiting.  Neurological:  Negative for dizziness, light-headedness and headaches.     Physical Exam Triage Vital Signs ED Triage Vitals  Encounter Vitals Group     BP 10/01/23 1540 (!) 167/93     Systolic BP Percentile --      Diastolic BP Percentile --      Pulse Rate 10/01/23 1540 87  Resp 10/01/23 1540 18     Temp 10/01/23 1540 98 F (36.7 C)     Temp Source 10/01/23 1540 Oral     SpO2 10/01/23 1540 98 %     Weight --      Height --      Head Circumference --      Peak Flow --      Pain Score 10/01/23 1541 3     Pain Loc --      Pain Education --      Exclude from Growth Chart --    No data found.  Updated Vital Signs BP (!) 167/93 (BP Location: Left Arm)   Pulse 87   Temp 98 F (36.7 C) (Oral)   Resp 18   LMP 05/22/2013   SpO2 98%   Visual Acuity Right Eye Distance:   Left Eye Distance:   Bilateral Distance:    Right Eye Near:   Left Eye Near:    Bilateral Near:     Physical Exam Vitals reviewed.  Constitutional:       General: She is awake. She is not in acute distress.    Appearance: Normal appearance. She is well-developed. She is not ill-appearing.     Comments: Very pleasant female appears stated age in no acute distress sitting comfortably in exam room  HENT:     Head: Normocephalic and atraumatic.     Right Ear: Tympanic membrane, ear canal and external ear normal. Tympanic membrane is not erythematous or bulging.     Left Ear: Tympanic membrane, ear canal and external ear normal. Tympanic membrane is not erythematous or bulging.     Nose:     Right Sinus: No maxillary sinus tenderness or frontal sinus tenderness.     Left Sinus: No maxillary sinus tenderness or frontal sinus tenderness.     Mouth/Throat:     Pharynx: Uvula midline. Posterior oropharyngeal erythema present. No oropharyngeal exudate.     Tonsils: Tonsillar exudate present. No tonsillar abscesses. 3+ on the right. 3+ on the left.  Cardiovascular:     Rate and Rhythm: Normal rate and regular rhythm.     Heart sounds: Normal heart sounds, S1 normal and S2 normal. No murmur heard. Pulmonary:     Effort: Pulmonary effort is normal.     Breath sounds: Normal breath sounds. No wheezing, rhonchi or rales.     Comments: Clear to auscultation bilaterally Lymphadenopathy:     Head:     Right side of head: No submental, submandibular or tonsillar adenopathy.     Left side of head: No submental, submandibular or tonsillar adenopathy.     Cervical: No cervical adenopathy.  Psychiatric:        Behavior: Behavior is cooperative.      UC Treatments / Results  Labs (all labs ordered are listed, but only abnormal results are displayed) Labs Reviewed  CULTURE, GROUP A STREP [facility])  POCT RAPID STREP A (OFFICE)    EKG   Radiology No results found.  Procedures Procedures (including critical care time)  Medications Ordered in UC Medications - No data to display  Initial Impression / Assessment and Plan / UC Course  I have reviewed  the triage vital signs and the nursing notes.  Pertinent labs & imaging results that were available during my care of the patient were reviewed by me and considered in my medical decision making (see chart for details).     Patient is well-appearing, afebrile, nontoxic, nontachycardic.  She  has significant erythema of her tonsils so strep testing was obtained and she reports a history of recurrent strep infections and this was negative in clinic.  Will send for culture but defer antibiotics until culture results are available.  We discussed symptoms are likely viral in nature and she was encouraged to use over-the-counter medications for symptom management.  She was given Promethazine DM to help with cough we discussed that this can be sedating and she is not to drive or drink alcohol while taking it.  Recommend that she rest and drink plenty of fluid.  If her symptoms are not improving within a week or if she has any worsening symptoms she needs to be seen immediately.  She is return precautions given.  Work excuse note provided.  Final Clinical Impressions(s) / UC Diagnoses   Final diagnoses:  Upper respiratory tract infection, unspecified type  Sore throat     Discharge Instructions      You were negative for strep.  We will contact you if your culture is positive.  I suspect you have a virus.  Use Promethazine DM for cough.  This will make you sleepy so do not drive or drink alcohol with taking it.  Make sure you rest and drink plenty of fluid.  Use Tylenol ibuprofen to help manage her symptoms.  If anything worsens or changes and you have difficulty swallowing, swelling of your throat, shortness of breath, muffled voice, fever, worsening cough you need to be seen immediately.     ED Prescriptions     Medication Sig Dispense Auth. Provider   promethazine-dextromethorphan (PROMETHAZINE-DM) 6.25-15 MG/5ML syrup Take 5 mLs by mouth 2 (two) times daily as needed for cough. 118 mL Sully Dyment,  Persia Lintner K, PA-C      PDMP not reviewed this encounter.   Jeani Hawking, PA-C 10/01/23 1718

## 2023-10-01 NOTE — Discharge Instructions (Signed)
You were negative for strep.  We will contact you if your culture is positive.  I suspect you have a virus.  Use Promethazine DM for cough.  This will make you sleepy so do not drive or drink alcohol with taking it.  Make sure you rest and drink plenty of fluid.  Use Tylenol ibuprofen to help manage her symptoms.  If anything worsens or changes and you have difficulty swallowing, swelling of your throat, shortness of breath, muffled voice, fever, worsening cough you need to be seen immediately.

## 2023-10-04 LAB — CULTURE, GROUP A STREP (THRC)

## 2024-08-27 ENCOUNTER — Ambulatory Visit (HOSPITAL_COMMUNITY)
Admission: EM | Admit: 2024-08-27 | Discharge: 2024-08-27 | Disposition: A | Discharge status: Home / Self Care | Attending: Emergency Medicine | Admitting: Emergency Medicine

## 2024-08-27 DIAGNOSIS — J069 Acute upper respiratory infection, unspecified: Secondary | ICD-10-CM

## 2024-08-27 LAB — POC COVID19/FLU A&B COMBO
Covid Antigen, POC: NEGATIVE
Influenza A Antigen, POC: NEGATIVE
Influenza B Antigen, POC: NEGATIVE

## 2024-08-27 MED ORDER — PROMETHAZINE-DM 6.25-15 MG/5ML PO SYRP: 5.0000 mL | ORAL_SOLUTION | Freq: Four times a day (QID) | ORAL | 0 refills | Status: AC | PRN

## 2024-08-27 MED ORDER — AZELASTINE HCL 0.1 % NA SOLN: 1.0000 | Freq: Two times a day (BID) | NASAL | 0 refills | Status: AC

## 2024-08-27 NOTE — Discharge Instructions (Addendum)
 COVID and flu testing were negative, you most likely have a different viral illness.  Use the nasal spray twice daily to help with congestion.  You can also use over-the-counter Mucinex 1200 mg daily to help loosen up secretions in addition to drinking at least 64 ounces of water daily.  Use the cough syrup as needed, this may cause drowsiness or sedation.  Stay well-hydrated and continue taking her daily antihistamine.  If you are not taking a daily histamine I suggest starting cetirizine  10 mg daily, this is available over-the-counter.  This should help with sneezing and allergy symptoms.  Typical viral illnesses last 5 to 7 days.  If no improvement or any changes please seek follow-up care.

## 2024-08-27 NOTE — ED Provider Notes (Signed)
 " [facility]    CSN: [id number] Arrival date & time: 08/27/24  9140      History   Chief Complaint Chief Complaint  Patient presents with   Cough   Nasal Congestion   Fever    HPI Haley Erickson is a 63 y.o. female.   Patient presents to clinic over concern of nasal congestion, subjective fever, sinus pressure, rhinorrhea, sneezing and cough x2 days.   She has not had chest pain, wheezing or shortness of breath.  Has not had a subjective fever since the first night where she felt hot and cold.  Has had a lot of nasal congestion, rhinorrhea and sneezing.  She had a leftover Z-Pak from similar symptoms she had earlier this year so she started that yesterday.  Recent sick contacts at work.  The history is provided by the patient and medical records.  Cough Fever   No past medical history on file.  Patient Active Problem List   Diagnosis Date Noted   HTN (hypertension) 10/28/2020   Generalized anxiety disorder 10/28/2020   Type 2 diabetes mellitus (HCC) 10/28/2020    Past Surgical History:  Procedure Laterality Date   FOOT SURGERY Right 05/13/2016   TUBAL LIGATION      OB History   No obstetric history on file.      Home Medications    Prior to Admission medications  Medication Sig Start Date End Date Taking? Authorizing Provider  azelastine (ASTELIN) 0.1 % nasal spray Place 1 spray into both nostrils 2 (two) times daily. Use in each nostril as directed 08/27/24  Yes Luciel Brickman  N, FNP  fluticasone  (FLONASE ) 50 MCG/ACT nasal spray Place 2 sprays into both nostrils daily. 01/08/22  Yes Vonna Sharlet POUR, MD  hydrOXYzine  (ATARAX /VISTARIL ) 25 MG tablet Take 1 tablet (25 mg total) by mouth every 8 (eight) hours as needed for anxiety. 09/03/20  Yes McClung, Angela M, PA-C  lisinopril -hydrochlorothiazide  (ZESTORETIC ) 20-25 MG tablet TAKE 1 TABLET BY MOUTH DAILY 09/24/21  Yes Brien Belvie BRAVO, MD  metFORMIN  (GLUCOPHAGE ) 500 MG tablet TAKE 1 TABLET  BY MOUTH TWICE DAILY WITH A MEAL 08/03/22  Yes Brien Belvie BRAVO, MD  promethazine -dextromethorphan (PROMETHAZINE -DM) 6.25-15 MG/5ML syrup Take 5 mLs by mouth 4 (four) times daily as needed for cough. 08/27/24  Yes Dreama, Eiliana Drone  N, FNP    Family History Family History  Problem Relation Age of Onset   Diabetes Brother     Social History Social History[1]   Allergies   Patient has no known allergies.   Review of Systems Review of Systems  Per HPI  Physical Exam Triage Vital Signs ED Triage Vitals  Encounter Vitals Group     BP 08/27/24 1013 (!) 141/66     Girls Systolic BP Percentile --      Girls Diastolic BP Percentile --      Boys Systolic BP Percentile --      Boys Diastolic BP Percentile --      Pulse Rate 08/27/24 1013 74     Resp 08/27/24 1013 18     Temp 08/27/24 1013 97.9 F (36.6 C)     Temp Source 08/27/24 1013 Oral     SpO2 08/27/24 1013 95 %     Weight --      Height --      Head Circumference --      Peak Flow --      Pain Score 08/27/24 1019 0     Pain Loc --  Pain Education --      Exclude from Growth Chart --    No data found.  Updated Vital Signs BP (!) 141/66 (BP Location: Left Arm)   Pulse 74   Temp 97.9 F (36.6 C) (Oral)   Resp 18   LMP 05/22/2013   SpO2 95%   Visual Acuity Right Eye Distance:   Left Eye Distance:   Bilateral Distance:    Right Eye Near:   Left Eye Near:    Bilateral Near:     Physical Exam Vitals and nursing note reviewed.  Constitutional:      Appearance: Normal appearance.  HENT:     Head: Normocephalic and atraumatic.     Right Ear: Tympanic membrane, ear canal and external ear normal.     Left Ear: Tympanic membrane, ear canal and external ear normal.     Nose: Congestion and rhinorrhea present.     Mouth/Throat:     Mouth: Mucous membranes are moist.     Pharynx: Posterior oropharyngeal erythema present.  Eyes:     Conjunctiva/sclera: Conjunctivae normal.  Cardiovascular:     Rate and  Rhythm: Normal rate and regular rhythm.     Heart sounds: Normal heart sounds. No murmur heard. Pulmonary:     Effort: Pulmonary effort is normal. No respiratory distress.     Breath sounds: Normal breath sounds. No wheezing.  Skin:    General: Skin is warm and dry.  Neurological:     General: No focal deficit present.     Mental Status: She is alert.      UC Treatments / Results  Labs (all labs ordered are listed, but only abnormal results are displayed) Labs Reviewed  POC COVID19/FLU A&B COMBO    EKG   Radiology No results found.  Procedures Procedures (including critical care time)  Medications Ordered in UC Medications - No data to display  Initial Impression / Assessment and Plan / UC Course  I have reviewed the triage vital signs and the nursing notes.  Pertinent labs & imaging results that were available during my care of the patient were reviewed by me and considered in my medical decision making (see chart for details).  Vitals and triage reviewed, patient is hemodynamically stable.  Lungs vesicular, heart with regular rate and rhythm.  Congestion, rhinorrhea and posterior pharynx erythema present on physical exam.  Symptoms consistent with viral URI, COVID and flu testing negative.  Discussed azithromycin is most likely not beneficial in this instance, as symptoms are consistent with viral URI.  Symptomatic management for viral illness discussed.  Plan of care, follow-up care and return precautions given, no questions at this time.  Work note provided.     Final Clinical Impressions(s) / UC Diagnoses   Final diagnoses:  Viral URI with cough     Discharge Instructions      COVID and flu testing were negative, you most likely have a different viral illness.  Use the nasal spray twice daily to help with congestion.  You can also use over-the-counter Mucinex 1200 mg daily to help loosen up secretions in addition to drinking at least 64 ounces of water  daily.  Use the cough syrup as needed, this may cause drowsiness or sedation.  Stay well-hydrated and continue taking her daily antihistamine.  If you are not taking a daily histamine I suggest starting cetirizine  10 mg daily, this is available over-the-counter.  This should help with sneezing and allergy symptoms.  Typical viral illnesses last 5  to 7 days.  If no improvement or any changes please seek follow-up care.    ED Prescriptions     Medication Sig Dispense Auth. Provider   azelastine (ASTELIN) 0.1 % nasal spray Place 1 spray into both nostrils 2 (two) times daily. Use in each nostril as directed 30 mL Dreama, Annabeth Tortora  N, FNP   promethazine -dextromethorphan (PROMETHAZINE -DM) 6.25-15 MG/5ML syrup Take 5 mLs by mouth 4 (four) times daily as needed for cough. 118 mL Dreama, Zynasia Burklow  N, FNP      PDMP not reviewed this encounter.    [1]  Social History Tobacco Use   Smoking status: Some Days    Current packs/day: 0.25    Types: Cigarettes   Smokeless tobacco: Never  Substance Use Topics   Alcohol use: Yes    Comment: occcasional   Drug use: No     Dreama Broderick SAILOR, FNP 08/27/24 1108  "

## 2024-08-27 NOTE — ED Triage Notes (Signed)
 PT presents to the office for cough,fever, and nasal congestion x 2 days. No medication have been taken at this time.

## 2024-09-17 ENCOUNTER — Other Ambulatory Visit: Payer: Self-pay

## 2024-09-17 ENCOUNTER — Encounter (HOSPITAL_COMMUNITY): Payer: Self-pay

## 2024-09-17 ENCOUNTER — Emergency Department (HOSPITAL_COMMUNITY)

## 2024-09-17 ENCOUNTER — Emergency Department (HOSPITAL_COMMUNITY)
Admission: EM | Admit: 2024-09-17 | Discharge: 2024-09-17 | Disposition: A | Discharge status: Home / Self Care | Attending: Emergency Medicine | Admitting: Emergency Medicine

## 2024-09-17 DIAGNOSIS — I10 Essential (primary) hypertension: Secondary | ICD-10-CM | POA: Diagnosis not present

## 2024-09-17 DIAGNOSIS — M545 Low back pain, unspecified: Secondary | ICD-10-CM | POA: Diagnosis present

## 2024-09-17 DIAGNOSIS — Z79899 Other long term (current) drug therapy: Secondary | ICD-10-CM | POA: Diagnosis not present

## 2024-09-17 DIAGNOSIS — X500XXA Overexertion from strenuous movement or load, initial encounter: Secondary | ICD-10-CM | POA: Diagnosis not present

## 2024-09-17 DIAGNOSIS — S39012A Strain of muscle, fascia and tendon of lower back, initial encounter: Secondary | ICD-10-CM | POA: Diagnosis not present

## 2024-09-17 DIAGNOSIS — Z7984 Long term (current) use of oral hypoglycemic drugs: Secondary | ICD-10-CM | POA: Diagnosis not present

## 2024-09-17 DIAGNOSIS — E119 Type 2 diabetes mellitus without complications: Secondary | ICD-10-CM | POA: Insufficient documentation

## 2024-09-17 LAB — URINALYSIS, ROUTINE W REFLEX MICROSCOPIC
Bilirubin Urine: NEGATIVE
Glucose, UA: NEGATIVE mg/dL
Hgb urine dipstick: NEGATIVE
Ketones, ur: NEGATIVE mg/dL
Nitrite: NEGATIVE
Protein, ur: 30 mg/dL — AB
Specific Gravity, Urine: 1.026 (ref 1.005–1.030)
pH: 5 (ref 5.0–8.0)

## 2024-09-17 MED ORDER — METHOCARBAMOL 500 MG PO TABS
500.0000 mg | ORAL_TABLET | Freq: Once | ORAL | Status: AC
  Administered 2024-09-17: 500 mg via ORAL
  Filled 2024-09-17: qty 1

## 2024-09-17 MED ORDER — LIDOCAINE 5 % EX PTCH: 1.0000 | MEDICATED_PATCH | CUTANEOUS | 0 refills | Status: AC

## 2024-09-17 MED ORDER — METHOCARBAMOL 500 MG PO TABS: 500.0000 mg | ORAL_TABLET | Freq: Two times a day (BID) | ORAL | 0 refills | Status: AC

## 2024-09-17 MED ORDER — LIDOCAINE 5 % EX PTCH: 1.0000 | MEDICATED_PATCH | CUTANEOUS | 0 refills | Status: DC

## 2024-09-17 MED ORDER — METHOCARBAMOL 500 MG PO TABS: 500.0000 mg | ORAL_TABLET | Freq: Two times a day (BID) | ORAL | 0 refills | Status: DC

## 2024-09-17 MED ORDER — LIDOCAINE 5 % EX PTCH
1.0000 | MEDICATED_PATCH | CUTANEOUS | Status: DC
  Administered 2024-09-17: 1 via TRANSDERMAL
  Filled 2024-09-17: qty 1

## 2024-09-17 NOTE — ED Provider Triage Note (Signed)
 Emergency Medicine Provider Triage Evaluation Note  Haley Erickson , a 64 y.o. female  was evaluated in triage.  Pt complains of low back pain since this morning. Denies trauma/injury. States that the back pain is bilateral and at times goes down her legs. Denies urinary symptoms, however did mention concern for UTI while in room.   Review of Systems  Positive: Low back pain Negative:   Physical Exam  BP (!) 150/70   Pulse 91   Temp 98 F (36.7 C)   Resp 17   Ht 5' 7.5 (1.715 m)   Wt 122.5 kg   LMP 05/22/2013   SpO2 93%   BMI 41.66 kg/m  Gen:   Awake, no distress   Resp:  Normal effort  MSK:   Moves extremities without difficulty, low back generally tender to palpation Other:    Medical Decision Making  Medically screening exam initiated at 4:15 PM.  Appropriate orders placed.  NYIA TSAO was informed that the remainder of the evaluation will be completed by another provider, this initial triage assessment does not replace that evaluation, and the importance of remaining in the ED until their evaluation is complete.  Orders: xray of lumbar spine, robaxin , lidoderm  patch, UA   Janetta Terrall FALCON, PA-C 09/17/24 1616

## 2024-09-17 NOTE — ED Triage Notes (Signed)
 Patient states that she started having back pain this morning. Se states the pain is in her lower back in in both sides of her buttocks.  She states the pain kind of goes down her legs.

## 2024-09-17 NOTE — Discharge Instructions (Signed)
 It was a pleasure taking care of you today. You were seen in the Emergency Department for evaluation of lower back pain. Your work-up was reassuring. Your Xray showed no acute abnormality or fracture.  I do suspect that you likely pulled your lower back and have a muscle strain, from all of the recent heavy lifting you have been doing.  Therefore, I have sent in a prescription of a medication called Robaxin , which is a muscle relaxer.  You may take this up to 3 times per day.  However, it may cause drowsiness, so do not drive while taking this medication.  I have also sent over a prescription for lidocaine  patches, which you may wear on your lower back for additional pain relief. Refer to the attached documentation for further management of your symptoms. Follow up with your PCP if your symptoms continue.  Please return to the ER if you experience chest pain, trouble breathing, intractable nausea/vomiting or any other life threatening illnesses.

## 2024-09-17 NOTE — ED Provider Notes (Signed)
 " [facility] Provider Note   CSN: [id number] Arrival date & time: 09/17/24  1429     Patient presents with: Back Pain   Haley Erickson is a 64 y.o. female with past medical history of hypertension, diabetes, anxiety, who presents emergency department for evaluation of acute low back pain.  Patient reports that she has been moving a lot of boxes and heavy trash bags recently.  She states that she began experiencing lower back pain yesterday.  She reports difficulty with bending and extending her back.  Patient denies any falls or inciting injury.  She has not taken any medications prior to arrival.     Back Pain      Prior to Admission medications  Medication Sig Start Date End Date Taking? Authorizing Provider  azelastine  (ASTELIN ) 0.1 % nasal spray Place 1 spray into both nostrils 2 (two) times daily. Use in each nostril as directed 08/27/24   Mercer, [place]  G, FNP  fluticasone  (FLONASE ) 50 MCG/ACT nasal spray Place 2 sprays into both nostrils daily. 01/08/22   Vonna Sharlet POUR, MD  hydrOXYzine  (ATARAX /VISTARIL ) 25 MG tablet Take 1 tablet (25 mg total) by mouth every 8 (eight) hours as needed for anxiety. 09/03/20   Danton Jon CHRISTELLA, PA-C  lidocaine  (LIDODERM ) 5 % Place 1 patch onto the skin daily. Remove & Discard patch within 12 hours or as directed by MD 09/17/24   Torrence Marry RAMAN, PA-C  lisinopril -hydrochlorothiazide  (ZESTORETIC ) 20-25 MG tablet TAKE 1 TABLET BY MOUTH DAILY 09/24/21   Brien Belvie BRAVO, MD  metFORMIN  (GLUCOPHAGE ) 500 MG tablet TAKE 1 TABLET BY MOUTH TWICE DAILY WITH A MEAL 08/03/22   Brien Belvie BRAVO, MD  methocarbamol  (ROBAXIN ) 500 MG tablet Take 1 tablet (500 mg total) by mouth 2 (two) times daily. 09/17/24   Zakhari Fogel, Marry RAMAN, PA-C  promethazine -dextromethorphan (PROMETHAZINE -DM) 6.25-15 MG/5ML syrup Take 5 mLs by mouth 4 (four) times daily as needed for cough. 08/27/24   Ball, [place]  G, FNP    Allergies:  Patient has no known allergies.    Review of Systems  Musculoskeletal:  Positive for back pain.    Updated Vital Signs BP 122/79 (BP Location: Left Arm)   Pulse 66   Temp 98.1 F (36.7 C) (Oral)   Resp 16   Ht 5' 7.5 (1.715 m)   Wt 122.5 kg   LMP 05/22/2013   SpO2 96%   BMI 41.66 kg/m   Physical Exam Vitals and nursing note reviewed.  Constitutional:      Appearance: Normal appearance.  HENT:     Head: Normocephalic and atraumatic.     Mouth/Throat:     Mouth: Mucous membranes are moist.  Eyes:     General: No scleral icterus.       Right eye: No discharge.        Left eye: No discharge.     Conjunctiva/sclera: Conjunctivae normal.  Cardiovascular:     Rate and Rhythm: Normal rate and regular rhythm.     Pulses: Normal pulses.  Pulmonary:     Effort: Pulmonary effort is normal.     Breath sounds: Normal breath sounds.  Abdominal:     General: There is no distension.     Tenderness: There is no abdominal tenderness.  Musculoskeletal:        General: Tenderness present. No deformity.     Cervical back: Normal range of motion.     Comments: Patient with significant tenderness across the entirety  of her low back-right, spine and left side.  Pain is reproducible with palpation.  She also has worsening pain when her legs are extended, but denies radiation  Skin:    General: Skin is warm and dry.     Capillary Refill: Capillary refill takes less than 2 seconds.  Neurological:     Mental Status: She is alert.     Motor: No weakness.  Psychiatric:        Mood and Affect: Mood normal.     (all labs ordered are listed, but only abnormal results are displayed) Labs Reviewed  URINALYSIS, ROUTINE W REFLEX MICROSCOPIC - Abnormal; Notable for the following components:      Result Value   APPearance CLOUDY (*)    Protein, ur 30 (*)    Leukocytes,Ua LARGE (*)    Bacteria, UA RARE (*)    Non Squamous Epithelial 0-5 (*)    All other components within normal limits     EKG: None  Radiology: DG Lumbar Spine Complete Result Date: 09/17/2024 CLINICAL DATA:  Low back pain since this morning. Pain radiates into the legs. EXAM: LUMBAR SPINE - COMPLETE 4+ VIEW COMPARISON:  None Available. FINDINGS: There are 5 lumbar type vertebral bodies. The alignment is normal. The disc spaces are preserved, although there is mild intervertebral spurring within the lower thoracic and upper lumbar spine. Mild facet degenerative changes inferiorly. No evidence of acute fracture or pars defect. Mild sacroiliac degenerative changes bilaterally. IMPRESSION: Mild thoracolumbar spondylosis. No acute osseous findings or malalignment. Electronically Signed   By: Elsie Perone M.D.   On: 09/17/2024 17:05    Procedures   Medications Ordered in the ED  lidocaine  (LIDODERM ) 5 % 1 patch (1 patch Transdermal Patch Applied 09/17/24 1806)  methocarbamol  (ROBAXIN ) tablet 500 mg (500 mg Oral Given 09/17/24 1806)                                Medical Decision Making Risk Prescription drug management.   64 year old female who presents emergency department for evaluation of back pain.  Differential diagnoses: Lumbar strain, osteomyelitis, cauda equina syndrome, compression fracture, sciatica.  Patient's x-ray is unremarkable.  I personally viewed and interpreted these images and agree with the radiologist interpretation.  She does not have a history of drug use or recent injections so there is no indication for blood work.  Patient denies any bladder or bowel incontinence.  She does report tenderness to her lower back, as well as recent heavy lifting, making lumbar strain most likely cause of her symptoms.  I did give her a dose of Robaxin  as well as lidocaine  patches while she was in the emergency department.  I explained that she may take anti-inflammatories for pain control as well as using lidocaine  patches.  I did send her a prescription for Robaxin , informed her to only take this at night  as it can cause some drowsiness.  Patient verbalized understanding to this.  Recommendation for patient to follow-up with her PCP if her symptoms do not resolve.  Her vital signs are stable.  Patient is appropriate for discharge at this time.    Final diagnoses:  Strain of lumbar region, initial encounter    ED Discharge Orders          Ordered    lidocaine  (LIDODERM ) 5 %  Every 24 hours,   Status:  Discontinued        09/17/24 1854  methocarbamol  (ROBAXIN ) 500 MG tablet  2 times daily,   Status:  Discontinued        09/17/24 1854    lidocaine  (LIDODERM ) 5 %  Every 24 hours        09/17/24 1914    methocarbamol  (ROBAXIN ) 500 MG tablet  2 times daily        09/17/24 1914               Torrence Marry GORMAN DEVONNA 09/17/24 2328  "
